# Patient Record
Sex: Male | Born: 1970
Health system: Southern US, Community
[De-identification: ages and names within clinical notes are randomized; demographics above are authoritative.]

## PROBLEM LIST (undated history)

## (undated) DIAGNOSIS — I10 Essential (primary) hypertension: Secondary | ICD-10-CM

## (undated) DIAGNOSIS — E785 Hyperlipidemia, unspecified: Secondary | ICD-10-CM

## (undated) DIAGNOSIS — F17200 Nicotine dependence, unspecified, uncomplicated: Secondary | ICD-10-CM

## (undated) HISTORY — DX: Essential (primary) hypertension: I10

## (undated) HISTORY — DX: Hyperlipidemia, unspecified: E78.5

## (undated) HISTORY — DX: Nicotine dependence, unspecified, uncomplicated: F17.200

---

## 2015-06-19 ENCOUNTER — Encounter: Payer: Self-pay | Admitting: Family Medicine

## 2015-07-03 ENCOUNTER — Encounter: Payer: Self-pay | Admitting: Family Medicine

## 2015-07-03 ENCOUNTER — Ambulatory Visit (INDEPENDENT_AMBULATORY_CARE_PROVIDER_SITE_OTHER): Payer: 59 | Admitting: Family Medicine

## 2015-07-03 VITALS — BP 180/110 | HR 100 | Temp 97.7°F | Resp 16 | Ht 72.0 in | Wt 182.0 lb

## 2015-07-03 DIAGNOSIS — I10 Essential (primary) hypertension: Secondary | ICD-10-CM

## 2015-07-03 DIAGNOSIS — Z0000 Encounter for general adult medical examination without abnormal findings: Secondary | ICD-10-CM | POA: Diagnosis not present

## 2015-07-03 LAB — CBC WITH DIFFERENTIAL/PLATELET
Basophils Absolute: 0 10*3/uL (ref 0.0–0.1)
Basophils Relative: 0 % (ref 0–1)
Eosinophils Absolute: 0.1 10*3/uL (ref 0.0–0.7)
Eosinophils Relative: 2 % (ref 0–5)
HEMATOCRIT: 41.2 % (ref 39.0–52.0)
HEMOGLOBIN: 14 g/dL (ref 13.0–17.0)
LYMPHS ABS: 1.5 10*3/uL (ref 0.7–4.0)
LYMPHS PCT: 29 % (ref 12–46)
MCH: 29.2 pg (ref 26.0–34.0)
MCHC: 34 g/dL (ref 30.0–36.0)
MCV: 86 fL (ref 78.0–100.0)
MONO ABS: 0.5 10*3/uL (ref 0.1–1.0)
MPV: 10.2 fL (ref 8.6–12.4)
Monocytes Relative: 9 % (ref 3–12)
NEUTROS ABS: 3.1 10*3/uL (ref 1.7–7.7)
Neutrophils Relative %: 60 % (ref 43–77)
Platelets: 321 10*3/uL (ref 150–400)
RBC: 4.79 MIL/uL (ref 4.22–5.81)
RDW: 14.2 % (ref 11.5–15.5)
WBC: 5.2 10*3/uL (ref 4.0–10.5)

## 2015-07-03 LAB — LIPID PANEL
CHOL/HDL RATIO: 4.6 ratio (ref <=5.0)
CHOLESTEROL: 174 mg/dL (ref 125–200)
HDL: 38 mg/dL — ABNORMAL LOW (ref >=40)
LDL Cholesterol: 110 mg/dL (ref <130)
Triglycerides: 129 mg/dL (ref <150)
VLDL: 26 mg/dL (ref <30)

## 2015-07-03 LAB — COMPLETE METABOLIC PANEL WITH GFR
ALBUMIN: 3.7 g/dL (ref 3.6–5.1)
ALK PHOS: 104 U/L (ref 40–115)
ALT: 14 U/L (ref 9–46)
AST: 15 U/L (ref 10–40)
BUN: 9 mg/dL (ref 7–25)
CALCIUM: 9.6 mg/dL (ref 8.6–10.3)
CO2: 28 mmol/L (ref 20–31)
Chloride: 106 mmol/L (ref 98–110)
Creat: 0.81 mg/dL (ref 0.60–1.35)
Glucose, Bld: 81 mg/dL (ref 70–99)
POTASSIUM: 3.8 mmol/L (ref 3.5–5.3)
Sodium: 143 mmol/L (ref 135–146)
Total Bilirubin: 0.3 mg/dL (ref 0.2–1.2)
Total Protein: 6.9 g/dL (ref 6.1–8.1)

## 2015-07-03 MED ORDER — LOSARTAN POTASSIUM 50 MG PO TABS: 50.0000 mg | ORAL_TABLET | Freq: Every day | ORAL | Status: DC

## 2015-07-03 NOTE — Progress Notes (Signed)
Subjective:    Patient ID: Bobby Reed, male    DOB: 21-Feb-1971, 45 y.o.   MRN: [id number]  HPI  Patient is here today to establish care. He is a very pleasant 45 year old white male. He goes by [employer]. He works in [employer].  His wife works as a Engineer, civil (consulting) at [employer]. He denies any medical concerns. He would like to quit smoking. He does admit that his wife has told him his blood pressure is elevated at home. However the numbers that she has been recording are no where near the elevation that he has here today. He denies any chest pain shortness of breath or dyspnea on exertion. He denies any significant past medical history Past Medical History  Diagnosis Date  . Hypertension    No past surgical history on file. No current outpatient prescriptions on file prior to visit.   No current facility-administered medications on file prior to visit.   No Known Allergies Social History   Social History  . Marital Status: Married    Spouse Name: N/A  . Number of Children: N/A  . Years of Education: N/A   Occupational History  . Not on file.   Social History Main Topics  . Smoking status: Current Every Day Smoker -- 1.00 packs/day    Types: Cigarettes  . Smokeless tobacco: Never Used  . Alcohol Use: No  . Drug Use: No  . Sexual Activity: Yes   Other Topics Concern  . Not on file   Social History Narrative   Family History  Problem Relation Age of Onset  . Cancer Father     throat cancer/heavy smoker  . Heart disease Maternal Grandfather   . Dementia Paternal Grandmother   . Cancer Paternal Grandfather       Review of Systems  All other systems reviewed and are negative.      Objective:   Physical Exam  Constitutional: He is oriented to person, place, and time. He appears well-developed and well-nourished. No distress.  HENT:  Head: Normocephalic and atraumatic.  Right Ear: External ear normal.  Left Ear: External ear normal.  Nose: Nose normal.    Mouth/Throat: Oropharynx is clear and moist. No oropharyngeal exudate.  Eyes: Conjunctivae and EOM are normal. Pupils are equal, round, and reactive to light. Right eye exhibits no discharge. Left eye exhibits no discharge. No scleral icterus.  Neck: Normal range of motion. Neck supple. No JVD present. No tracheal deviation present. No thyromegaly present.  Cardiovascular: Normal rate, regular rhythm and normal heart sounds.  Exam reveals no gallop and no friction rub.   No murmur heard. Pulmonary/Chest: Effort normal and breath sounds normal. No stridor. No respiratory distress. He has no wheezes. He has no rales. He exhibits no tenderness.  Abdominal: Soft. Bowel sounds are normal. He exhibits no distension and no mass. There is no tenderness. There is no rebound and no guarding.  Musculoskeletal: Normal range of motion. He exhibits no edema or tenderness.  Lymphadenopathy:    He has no cervical adenopathy.  Neurological: He is alert and oriented to person, place, and time. He has normal reflexes. He displays normal reflexes. No cranial nerve deficit. He exhibits normal muscle tone. Coordination normal.  Skin: Skin is warm. No rash noted. He is not diaphoretic. No erythema. No pallor.  Psychiatric: He has a normal mood and affect. His behavior is normal. Judgment and thought content normal.  Vitals reviewed.         Assessment & Plan:  Benign essential HTN - Plan: losartan (COZAAR) 50 MG tablet, CBC with Differential/Platelet, COMPLETE METABOLIC PANEL WITH GFR, Lipid panel  Gen. medical exam  Physical exam is significant for significantly elevated blood pressure. Begin losartan 50 mg by mouth daily and recheck blood pressure in 2 weeks. We did discuss smoking cessation. If he is tolerating losartan in 2 weeks, I will try the patient on Chantix. I will also check a CBC, fasting lipid panel, CMP. Patient politely declines a flu shot today. Tetanus shot was performed 8 years ago and is  up-to-date. He is not due for prostate cancer screening or colonoscopy until age 45. Recheck blood pressure in 2 weeks

## 2015-08-01 ENCOUNTER — Telehealth: Payer: Self-pay | Admitting: Family Medicine

## 2015-08-01 NOTE — Telephone Encounter (Signed)
Patient aware of results.

## 2015-08-01 NOTE — Telephone Encounter (Signed)
Patient is calling to give a blood pressure reading, he said per dr Tanya Nonespickard, this reading is 122/86 and also would like his lab results  [phone]

## 2015-08-02 NOTE — Telephone Encounter (Signed)
Pt aware.

## 2015-08-02 NOTE — Telephone Encounter (Signed)
Blood pressure is good.  No changes at this time.

## 2015-08-17 ENCOUNTER — Telehealth: Payer: Self-pay | Admitting: Family Medicine

## 2015-08-17 MED ORDER — VARENICLINE TARTRATE 0.5 MG X 11 & 1 MG X 42 PO MISC: ORAL | Status: DC

## 2015-08-17 MED FILL — CHANTIX STARTING MONTH BOX: 0.5 MG X 11 | 28 days supply | Qty: 53 | Fill #0

## 2015-08-17 NOTE — Telephone Encounter (Signed)
Patient is calling to get chantix, since tolerating medication well  [facility]  [phone] if any questions

## 2015-08-17 NOTE — Telephone Encounter (Signed)
Per lov note med sent to pharm as requested

## 2015-09-24 ENCOUNTER — Encounter: Payer: Self-pay | Admitting: Family Medicine

## 2015-09-24 ENCOUNTER — Ambulatory Visit (HOSPITAL_COMMUNITY)
Admission: RE | Admit: 2015-09-24 | Discharge: 2015-09-24 | Disposition: A | Discharge status: Home / Self Care | Payer: 59 | Source: Other Acute Inpatient Hospital | Attending: Family Medicine | Admitting: Family Medicine

## 2015-09-24 ENCOUNTER — Ambulatory Visit (INDEPENDENT_AMBULATORY_CARE_PROVIDER_SITE_OTHER): Payer: 59 | Admitting: Family Medicine

## 2015-09-24 VITALS — BP 120/74 | HR 68 | Temp 98.3°F | Resp 16 | Wt 184.0 lb

## 2015-09-24 DIAGNOSIS — J189 Pneumonia, unspecified organism: Secondary | ICD-10-CM | POA: Diagnosis not present

## 2015-09-24 MED ORDER — AZITHROMYCIN 250 MG PO TABS: ORAL_TABLET | ORAL | Status: DC

## 2015-09-24 MED FILL — AZITHROMYCIN 250 MG TABLET: 250 | 5 days supply | Qty: 6 | Fill #0

## 2015-09-24 NOTE — Progress Notes (Signed)
   Subjective:    Patient ID: Bobby Reed, male    DOB: 11/07/1970, 45 y.o.   MRN: [id number]  HPI Patient reports a cough for 3 weeks. The cough is productive of thick green sputum. He also reports dyspnea on exertion, and fatigue. Symptoms began after his grandmother developed pneumonia. His son has also had similar symptoms. He denies any fever. He denies any hemoptysis. He denies any pleurisy. He does report night sweats. Physical exam however is much worse than he physically appears. He has diminished breath sounds bilaterally with bibasilar crackles Past Medical History  Diagnosis Date  . Hypertension    No past surgical history on file. Current Outpatient Prescriptions on File Prior to Visit  Medication Sig Dispense Refill  . losartan (COZAAR) 50 MG tablet Take 1 tablet (50 mg total) by mouth daily. 90 tablet 3  . varenicline (CHANTIX PAK) 0.5 MG X 11 & 1 MG X 42 tablet As directed 53 tablet 0   No current facility-administered medications on file prior to visit.   No Known Allergies Social History   Social History  . Marital Status: Married    Spouse Name: N/A  . Number of Children: N/A  . Years of Education: N/A   Occupational History  . Not on file.   Social History Main Topics  . Smoking status: Current Every Day Smoker -- 1.00 packs/day    Types: Cigarettes  . Smokeless tobacco: Never Used  . Alcohol Use: No  . Drug Use: No  . Sexual Activity: Yes   Other Topics Concern  . Not on file   Social History Narrative      Review of Systems  All other systems reviewed and are negative.      Objective:   Physical Exam  HENT:  Right Ear: External ear normal.  Left Ear: External ear normal.  Nose: Nose normal.  Mouth/Throat: Oropharynx is clear and moist.  Eyes: Conjunctivae are normal.  Neck: Neck supple.  Cardiovascular: Normal rate and regular rhythm.   Pulmonary/Chest: Effort normal. He has decreased breath sounds. He has wheezes in the right lower  field and the left lower field. He has rales.  Lymphadenopathy:    He has no cervical adenopathy.          Assessment & Plan:  Walking pneumonia - Plan: azithromycin (ZITHROMAX) 250 MG tablet, DG Chest 2 View  Patient's exam is consistent with walking pneumonia. I want him to go get an x-ray immediately. Meanwhile start the patient on a Z-Pak for community-acquired pneumonia. Await the results of the chest x-ray. Follow-up later this week if no better or sooner if worse.

## 2015-09-25 ENCOUNTER — Ambulatory Visit (HOSPITAL_COMMUNITY)
Admission: RE | Admit: 2015-09-25 | Discharge: 2015-09-25 | Disposition: A | Discharge status: Home / Self Care | Payer: 59 | Source: Ambulatory Visit | Attending: Family Medicine | Admitting: Family Medicine

## 2015-09-25 DIAGNOSIS — R05 Cough: Secondary | ICD-10-CM | POA: Diagnosis not present

## 2015-09-25 DIAGNOSIS — J189 Pneumonia, unspecified organism: Secondary | ICD-10-CM

## 2015-10-05 MED FILL — LOSARTAN POTASSIUM 50 MG TA: 50 | 90 days supply | Qty: 90 | Fill #0

## 2015-10-10 ENCOUNTER — Other Ambulatory Visit: Payer: Self-pay | Admitting: Family Medicine

## 2015-10-10 MED ORDER — VARENICLINE TARTRATE 1 MG PO TABS: 1.0000 mg | ORAL_TABLET | Freq: Two times a day (BID) | ORAL | Status: DC

## 2015-10-10 MED FILL — CHANTIX 1 MG TABLET: 1 | 30 days supply | Qty: 60 | Fill #0

## 2015-10-10 NOTE — Telephone Encounter (Signed)
Medication called/sent to requested pharmacy  

## 2016-01-01 ENCOUNTER — Encounter: Payer: Self-pay | Admitting: Family Medicine

## 2016-01-01 ENCOUNTER — Ambulatory Visit (INDEPENDENT_AMBULATORY_CARE_PROVIDER_SITE_OTHER): Payer: 59 | Admitting: Family Medicine

## 2016-01-01 VITALS — BP 150/98 | HR 84 | Temp 97.9°F | Resp 18 | Ht 72.0 in | Wt 197.0 lb

## 2016-01-01 DIAGNOSIS — R05 Cough: Secondary | ICD-10-CM | POA: Diagnosis not present

## 2016-01-01 DIAGNOSIS — R053 Chronic cough: Secondary | ICD-10-CM

## 2016-01-01 NOTE — Progress Notes (Signed)
Subjective:    Patient ID: Bobby Reed, male    DOB: 26-Jul-1970, 45 y.o.   MRN: [id number]  HPI  09/24/15 Patient reports a cough for 3 weeks. The cough is productive of thick green sputum. He also reports dyspnea on exertion, and fatigue. Symptoms began after his grandmother developed pneumonia. His son has also had similar symptoms. He denies any fever. He denies any hemoptysis. He denies any pleurisy. He does report night sweats. Physical exam however is much worse than he physically appears. He has diminished breath sounds bilaterally with bibasilar crackles.  At that time, my plan was: Patient's exam is consistent with walking pneumonia. I want him to go get an x-ray immediately. Meanwhile start the patient on a Z-Pak for community-acquired pneumonia. Await the results of the chest x-ray. Follow-up later this week if no better or sooner if worse.  01/01/16 Chest x-ray was clear. Patient completed antibiotics. His cough never completely went away. He no longer has a productive cough but he continues to have a daily cough that is more of a tickle in his throat. He also reports wheezing. His wife states that his cough is worse thing in the morning. She is concerned that he may be aspirating stomach acid/laryngo-esophageal reflux that may be causing his cough. She tried him on Prilosec 20 mg a day. This helped some. On his exam today he still has diminished breath sounds bilaterally with faint expiratory wheezing and occasional expiratory rhonchi. He does have a 30+ pack year history of smoking. He works in [employer] but denies any exposure to toxic chemicals. He denies any allergies. He denies any rhinorrhea or sneezing. He denies any sinus pressure Past Medical History:  Diagnosis Date  . Hypertension    No past surgical history on file. Current Outpatient Prescriptions on File Prior to Visit  Medication Sig Dispense Refill  . losartan (COZAAR) 50 MG tablet Take 1 tablet (50 mg total) by  mouth daily. 90 tablet 3  . varenicline (CHANTIX CONTINUING MONTH PAK) 1 MG tablet Take 1 tablet (1 mg total) by mouth 2 (two) times daily. 60 tablet 2   No current facility-administered medications on file prior to visit.    No Known Allergies Social History   Social History  . Marital status: Married    Spouse name: N/A  . Number of children: N/A  . Years of education: N/A   Occupational History  . Not on file.   Social History Main Topics  . Smoking status: Former Smoker    Packs/day: 1.00    Types: Cigarettes  . Smokeless tobacco: Never Used  . Alcohol use No  . Drug use: No  . Sexual activity: Yes   Other Topics Concern  . Not on file   Social History Narrative  . No narrative on file      Review of Systems  All other systems reviewed and are negative.      Objective:   Physical Exam  HENT:  Right Ear: External ear normal.  Left Ear: External ear normal.  Nose: Nose normal.  Mouth/Throat: Oropharynx is clear and moist.  Eyes: Conjunctivae are normal.  Neck: Neck supple.  Cardiovascular: Normal rate and regular rhythm.   Pulmonary/Chest: Effort normal. He has decreased breath sounds. He has wheezes in the right lower field and the left lower field. He has rales.  Lymphadenopathy:    He has no cervical adenopathy.          Assessment & Plan:  Chronic cough I suspect that the patient may have an element of COPD given his exam and his smoking history. The diminished breath sounds in the persistent rhonchorous breath sounds makes me concerned about possible interstitial lung disease. Certainly laryngo-esophageal reflux as a possibility as a cause of a chronic cough as well. I will start with pulmonary function test. Depending upon this, I recommend a CT scan of the chest to evaluate for interstitial lung disease. I see no evidence of his exam of allergies. PFTs reveal an FEV1 of 3.19 L, 72% of predicted, an FVC of 3.68 L 67% of predicted, an FEV1 to FVC  ratio of 87%. This suggests restrictive lung disease.  However his presentation is more consistent with obstructive lung disease. I will try the patient on Symbicort 160/4.5 2 puffs inhaled twice daily and recheck in 2 weeks. If symptoms are not improving, proceed with a CT scan of the chest

## 2016-01-15 ENCOUNTER — Ambulatory Visit (INDEPENDENT_AMBULATORY_CARE_PROVIDER_SITE_OTHER): Payer: 59 | Admitting: Family Medicine

## 2016-01-15 ENCOUNTER — Encounter: Payer: Self-pay | Admitting: Family Medicine

## 2016-01-15 VITALS — BP 134/98 | HR 80 | Temp 97.6°F | Resp 16 | Ht 72.0 in | Wt 196.0 lb

## 2016-01-15 DIAGNOSIS — R05 Cough: Secondary | ICD-10-CM

## 2016-01-15 DIAGNOSIS — R053 Chronic cough: Secondary | ICD-10-CM

## 2016-01-15 NOTE — Progress Notes (Signed)
Subjective:    Patient ID: Bobby Reed, male    DOB: 10/12/1970, 45 y.o.   MRN: [id number]  HPI  09/24/15 Patient reports a cough for 3 weeks. The cough is productive of thick green sputum. He also reports dyspnea on exertion, and fatigue. Symptoms began after his grandmother developed pneumonia. His son has also had similar symptoms. He denies any fever. He denies any hemoptysis. He denies any pleurisy. He does report night sweats. Physical exam however is much worse than he physically appears. He has diminished breath sounds bilaterally with bibasilar crackles.  At that time, my plan was: Patient's exam is consistent with walking pneumonia. I want him to go get an x-ray immediately. Meanwhile start the patient on a Z-Pak for community-acquired pneumonia. Await the results of the chest x-ray. Follow-up later this week if no better or sooner if worse.  01/01/16 Chest x-ray was clear. Patient completed antibiotics. His cough never completely went away. He no longer has a productive cough but he continues to have a daily cough that is more of a tickle in his throat. He also reports wheezing. His wife states that his cough is worse thing in the morning. She is concerned that he may be aspirating stomach acid/laryngo-esophageal reflux that may be causing his cough. She tried him on Prilosec 20 mg a day. This helped some. On his exam today he still has diminished breath sounds bilaterally with faint expiratory wheezing and occasional expiratory rhonchi. He does have a 30+ pack year history of smoking. He works in [employer] but denies any exposure to toxic chemicals. He denies any allergies. He denies any rhinorrhea or sneezing. He denies any sinus pressure.  At that time, my plan was: I suspect that the patient may have an element of COPD given his exam and his smoking history. The diminished breath sounds in the persistent rhonchorous breath sounds makes me concerned about possible interstitial lung  disease. Certainly laryngo-esophageal reflux as a possibility as a cause of a chronic cough as well. I will start with pulmonary function test. Depending upon this, I recommend a CT scan of the chest to evaluate for interstitial lung disease. I see no evidence of his exam of allergies. PFTs reveal an FEV1 of 3.19 L, 72% of predicted, an FVC of 3.68 L 67% of predicted, an FEV1 to FVC ratio of 87%. This suggests restrictive lung disease.  However his presentation is more consistent with obstructive lung disease. I will try the patient on Symbicort 160/4.5 2 puffs inhaled twice daily and recheck in 2 weeks. If symptoms are not improving, proceed with a CT scan of the chest  01/15/16 The patient's cough completely went away after starting Symbicort. Although he is also been on omeprazole around the same time. He feels much better now. On his exam today, he still has some faint expiratory wheezing but this has improved dramatically. I believe the majority of his chronic cough is due to underlying obstructive lung disease/emphysema. Past Medical History:  Diagnosis Date  . Hypertension    No past surgical history on file. Current Outpatient Prescriptions on File Prior to Visit  Medication Sig Dispense Refill  . losartan (COZAAR) 50 MG tablet Take 1 tablet (50 mg total) by mouth daily. 90 tablet 3  . omeprazole (PRILOSEC) 20 MG capsule Take 20 mg by mouth daily.    . varenicline (CHANTIX CONTINUING MONTH PAK) 1 MG tablet Take 1 tablet (1 mg total) by mouth 2 (two) times daily. 60 tablet  2   No current facility-administered medications on file prior to visit.    No Known Allergies Social History   Social History  . Marital status: Married    Spouse name: N/A  . Number of children: N/A  . Years of education: N/A   Occupational History  . Not on file.   Social History Main Topics  . Smoking status: Former Smoker    Packs/day: 1.00    Types: Cigarettes  . Smokeless tobacco: Never Used  .  Alcohol use No  . Drug use: No  . Sexual activity: Yes   Other Topics Concern  . Not on file   Social History Narrative  . No narrative on file      Review of Systems  All other systems reviewed and are negative.      Objective:   Physical Exam  HENT:  Right Ear: External ear normal.  Left Ear: External ear normal.  Nose: Nose normal.  Mouth/Throat: Oropharynx is clear and moist.  Eyes: Conjunctivae are normal.  Neck: Neck supple.  Cardiovascular: Normal rate and regular rhythm.   Pulmonary/Chest: Effort normal. He has no decreased breath sounds. He has wheezes. He has no rhonchi. He has no rales.  Lymphadenopathy:    He has no cervical adenopathy.  Vitals reviewed.         Assessment & Plan:  Chronic cough I believe the patient's chronic cough is due to obstructive lung disease and emphysema. I believe it was exacerbated by respiratory infection. I will  Have the patient discontinue omeprazole. Should the cough resume, I would leave him on the omeprazole indefinitely.  I have given the patient enough samples of symbicort to last 2 months.  If, at that time, he continues to refrain from smoking, I would like him to try to discontinue symbicort and see if the cough returns.

## 2016-01-18 MED FILL — LOSARTAN POTASSIUM 50 MG TA: 50 | 90 days supply | Qty: 90 | Fill #1

## 2016-05-12 MED FILL — LOSARTAN POTASSIUM 50 MG TA: 50 | 90 days supply | Qty: 90 | Fill #2

## 2016-08-18 ENCOUNTER — Other Ambulatory Visit: Payer: Self-pay | Admitting: Family Medicine

## 2016-08-18 DIAGNOSIS — I10 Essential (primary) hypertension: Secondary | ICD-10-CM

## 2016-08-18 MED FILL — LOSARTAN POTASSIUM 50 MG TA: 50 | 90 days supply | Qty: 90 | Fill #0

## 2016-12-15 MED FILL — LOSARTAN POTASSIUM 50 MG TA: 50 | 90 days supply | Qty: 90 | Fill #1

## 2017-03-12 ENCOUNTER — Ambulatory Visit (INDEPENDENT_AMBULATORY_CARE_PROVIDER_SITE_OTHER): Payer: 59 | Admitting: Family Medicine

## 2017-03-12 ENCOUNTER — Encounter: Payer: Self-pay | Admitting: Family Medicine

## 2017-03-12 VITALS — BP 138/102 | HR 93 | Temp 98.2°F | Resp 16 | Ht 72.0 in | Wt 191.0 lb

## 2017-03-12 DIAGNOSIS — J40 Bronchitis, not specified as acute or chronic: Secondary | ICD-10-CM | POA: Diagnosis not present

## 2017-03-12 DIAGNOSIS — I10 Essential (primary) hypertension: Secondary | ICD-10-CM

## 2017-03-12 MED ORDER — AZITHROMYCIN 250 MG PO TABS: ORAL_TABLET | ORAL | 0 refills | Status: DC

## 2017-03-12 MED ORDER — HYDROCHLOROTHIAZIDE 25 MG PO TABS: 25.0000 mg | ORAL_TABLET | Freq: Every day | ORAL | 3 refills | Status: DC

## 2017-03-12 MED FILL — AZITHROMYCIN 250 MG TABLET: 250 | 5 days supply | Qty: 6 | Fill #0

## 2017-03-12 MED FILL — HYDROCHLOROTHIAZIDE 25 MG T: 25 | 90 days supply | Qty: 90 | Fill #0

## 2017-03-12 NOTE — Progress Notes (Signed)
Subjective:    Patient ID: Bobby Reed, male    DOB: 10/17/1970, 46 y.o.   MRN: [id number]  HPI  09/24/15 Patient reports a cough for 3 weeks. The cough is productive of thick green sputum. He also reports dyspnea on exertion, and fatigue. Symptoms began after his grandmother developed pneumonia. His son has also had similar symptoms. He denies any fever. He denies any hemoptysis. He denies any pleurisy. He does report night sweats. Physical exam however is much worse than he physically appears. He has diminished breath sounds bilaterally with bibasilar crackles.  At that time, my plan was: Patient's exam is consistent with walking pneumonia. I want him to go get an x-ray immediately. Meanwhile start the patient on a Z-Pak for community-acquired pneumonia. Await the results of the chest x-ray. Follow-up later this week if no better or sooner if worse.  01/01/16 Chest x-ray was clear. Patient completed antibiotics. His cough never completely went away. He no longer has a productive cough but he continues to have a daily cough that is more of a tickle in his throat. He also reports wheezing. His wife states that his cough is worse thing in the morning. She is concerned that he may be aspirating stomach acid/laryngo-esophageal reflux that may be causing his cough. She tried him on Prilosec 20 mg a day. This helped some. On his exam today he still has diminished breath sounds bilaterally with faint expiratory wheezing and occasional expiratory rhonchi. He does have a 30+ pack year history of smoking. He works in [employer] but denies any exposure to toxic chemicals. He denies any allergies. He denies any rhinorrhea or sneezing. He denies any sinus pressure.  At that time, my plan was: I suspect that the patient may have an element of COPD given his exam and his smoking history. The diminished breath sounds in the persistent rhonchorous breath sounds makes me concerned about possible interstitial lung  disease. Certainly laryngo-esophageal reflux as a possibility as a cause of a chronic cough as well. I will start with pulmonary function test. Depending upon this, I recommend a CT scan of the chest to evaluate for interstitial lung disease. I see no evidence of his exam of allergies. PFTs reveal an FEV1 of 3.19 L, 72% of predicted, an FVC of 3.68 L 67% of predicted, an FEV1 to FVC ratio of 87%. This suggests restrictive lung disease.  However his presentation is more consistent with obstructive lung disease. I will try the patient on Symbicort 160/4.5 2 puffs inhaled twice daily and recheck in 2 weeks. If symptoms are not improving, proceed with a CT scan of the chest  01/15/16 The patient's cough completely went away after starting Symbicort. Although he is also been on omeprazole around the same time. He feels much better now. On his exam today, he still has some faint expiratory wheezing but this has improved dramatically. I believe the majority of his chronic cough is due to underlying obstructive lung disease/emphysema.  At that time, my plan was: I believe the patient's chronic cough is due to obstructive lung disease and emphysema. I believe it was exacerbated by respiratory infection. I will  Have the patient discontinue omeprazole. Should the cough resume, I would leave him on the omeprazole indefinitely.  I have given the patient enough samples of symbicort to last 2 months.  If, at that time, he continues to refrain from smoking, I would like him to try to discontinue symbicort and see if the cough returns.  03/12/17 Patient is here today accompanied by his wife.  He reports a one-month history of chest congestion cough and wheezing.  They believe is due to the blood pressure medication not the smoking.  Unfortunately he continues to smoke.  Therefore he is discontinued losartan and his blood pressure is high as a result.  He denies any hemoptysis.  He denies any fevers or chills.  He does  report increasing shortness of breath.  I am more suspicious of smoking as a cause of his frequent and recurrent cough or possibly even acid reflux. Past Medical History:  Diagnosis Date  . Hypertension    No past surgical history on file. Current Outpatient Medications on File Prior to Visit  Medication Sig Dispense Refill  . losartan (COZAAR) 50 MG tablet TAKE 1 TABLET BY MOUTH ONCE DAILY (Patient not taking: Reported on 03/12/2017) 330 tablet 0   No current facility-administered medications on file prior to visit.    No Known Allergies Social History   Socioeconomic History  . Marital status: Married    Spouse name: Not on file  . Number of children: Not on file  . Years of education: Not on file  . Highest education level: Not on file  Social Needs  . Financial resource strain: Not on file  . Food insecurity - worry: Not on file  . Food insecurity - inability: Not on file  . Transportation needs - medical: Not on file  . Transportation needs - non-medical: Not on file  Occupational History  . Not on file  Tobacco Use  . Smoking status: Current Every Day Smoker    Packs/day: 1.00    Types: Cigarettes  . Smokeless tobacco: Never Used  Substance and Sexual Activity  . Alcohol use: No  . Drug use: No  . Sexual activity: Yes  Other Topics Concern  . Not on file  Social History Narrative  . Not on file      Review of Systems  All other systems reviewed and are negative.      Objective:   Physical Exam  HENT:  Right Ear: External ear normal.  Left Ear: External ear normal.  Nose: Nose normal.  Mouth/Throat: Oropharynx is clear and moist.  Eyes: Conjunctivae are normal.  Neck: Neck supple.  Cardiovascular: Normal rate and regular rhythm.  Pulmonary/Chest: Effort normal. He has no decreased breath sounds. He has wheezes. He has no rhonchi. He has no rales.  Lymphadenopathy:    He has no cervical adenopathy.  Vitals reviewed.         Assessment & Plan:    Bronchitis  Benign essential HTN Patient does appear to have bronchitis today on exam and is wheezing with chest congestion.  I will start the patient on a Z-Pak for bronchitis.  If worsening, I will start him on prednisone.  I explained to the patient and his wife that I do not feel losartan is the cause of his cough.  However I am happy to discontinue the medication and switch to hydrochlorothiazide 25 mg a day to manage his high blood pressure just in case.  However I strongly encouraged him to pursue smoking cessation.  If he continues to have frequent upper respiratory infections and cough and wheezing, I will be glad to get a second opinion with the pulmonologist but I feel that the cough is most likely due to his persistent abuse of tobacco.

## 2017-07-15 IMAGING — DX DG CHEST 2V
2 series · 2 of 2 positions shown · non-contrast
Comparison: None.

CLINICAL DATA: Pneumonia for 3 weeks.  Productive cough.

EXAM:
CHEST  2 VIEW

[chest pa]
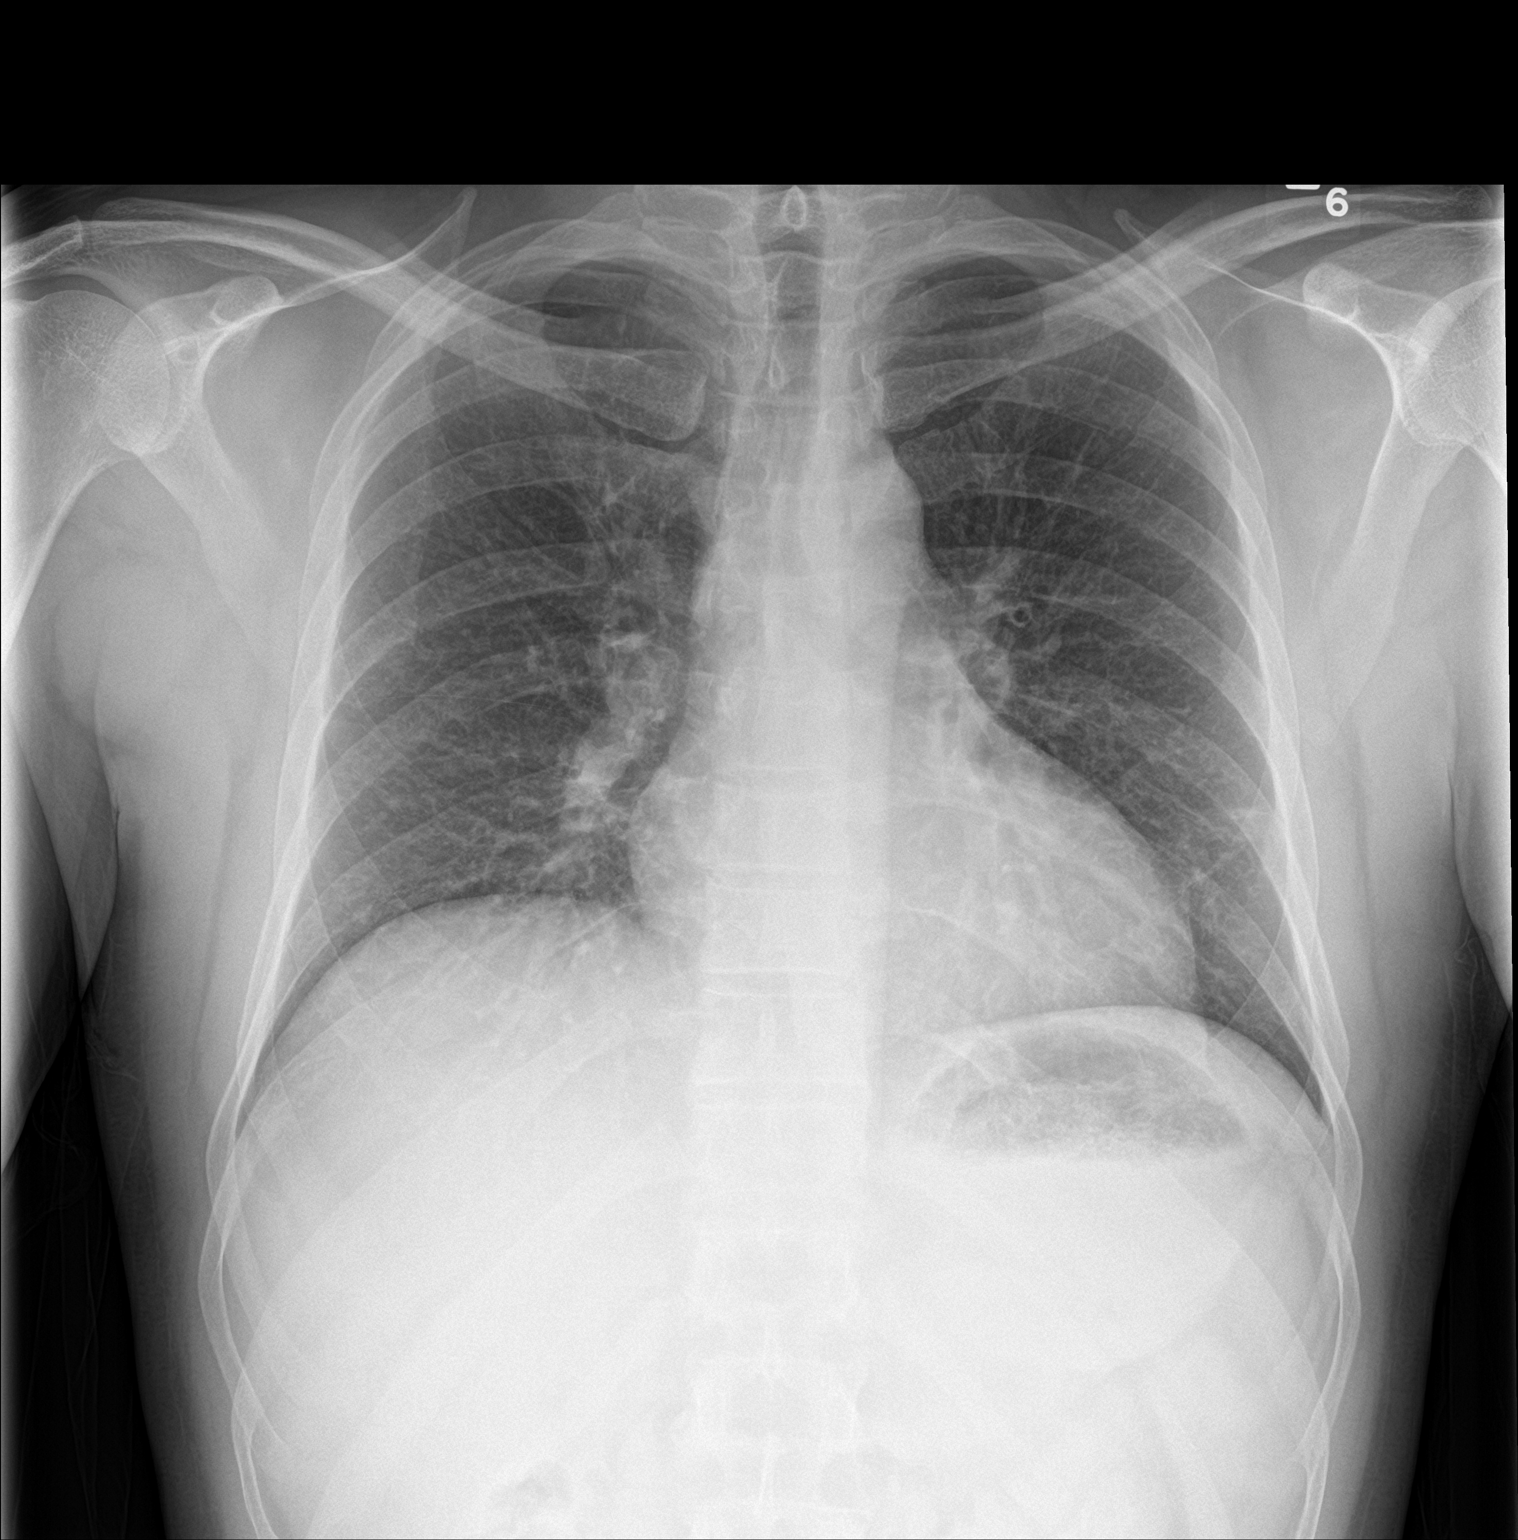

[chest lat]
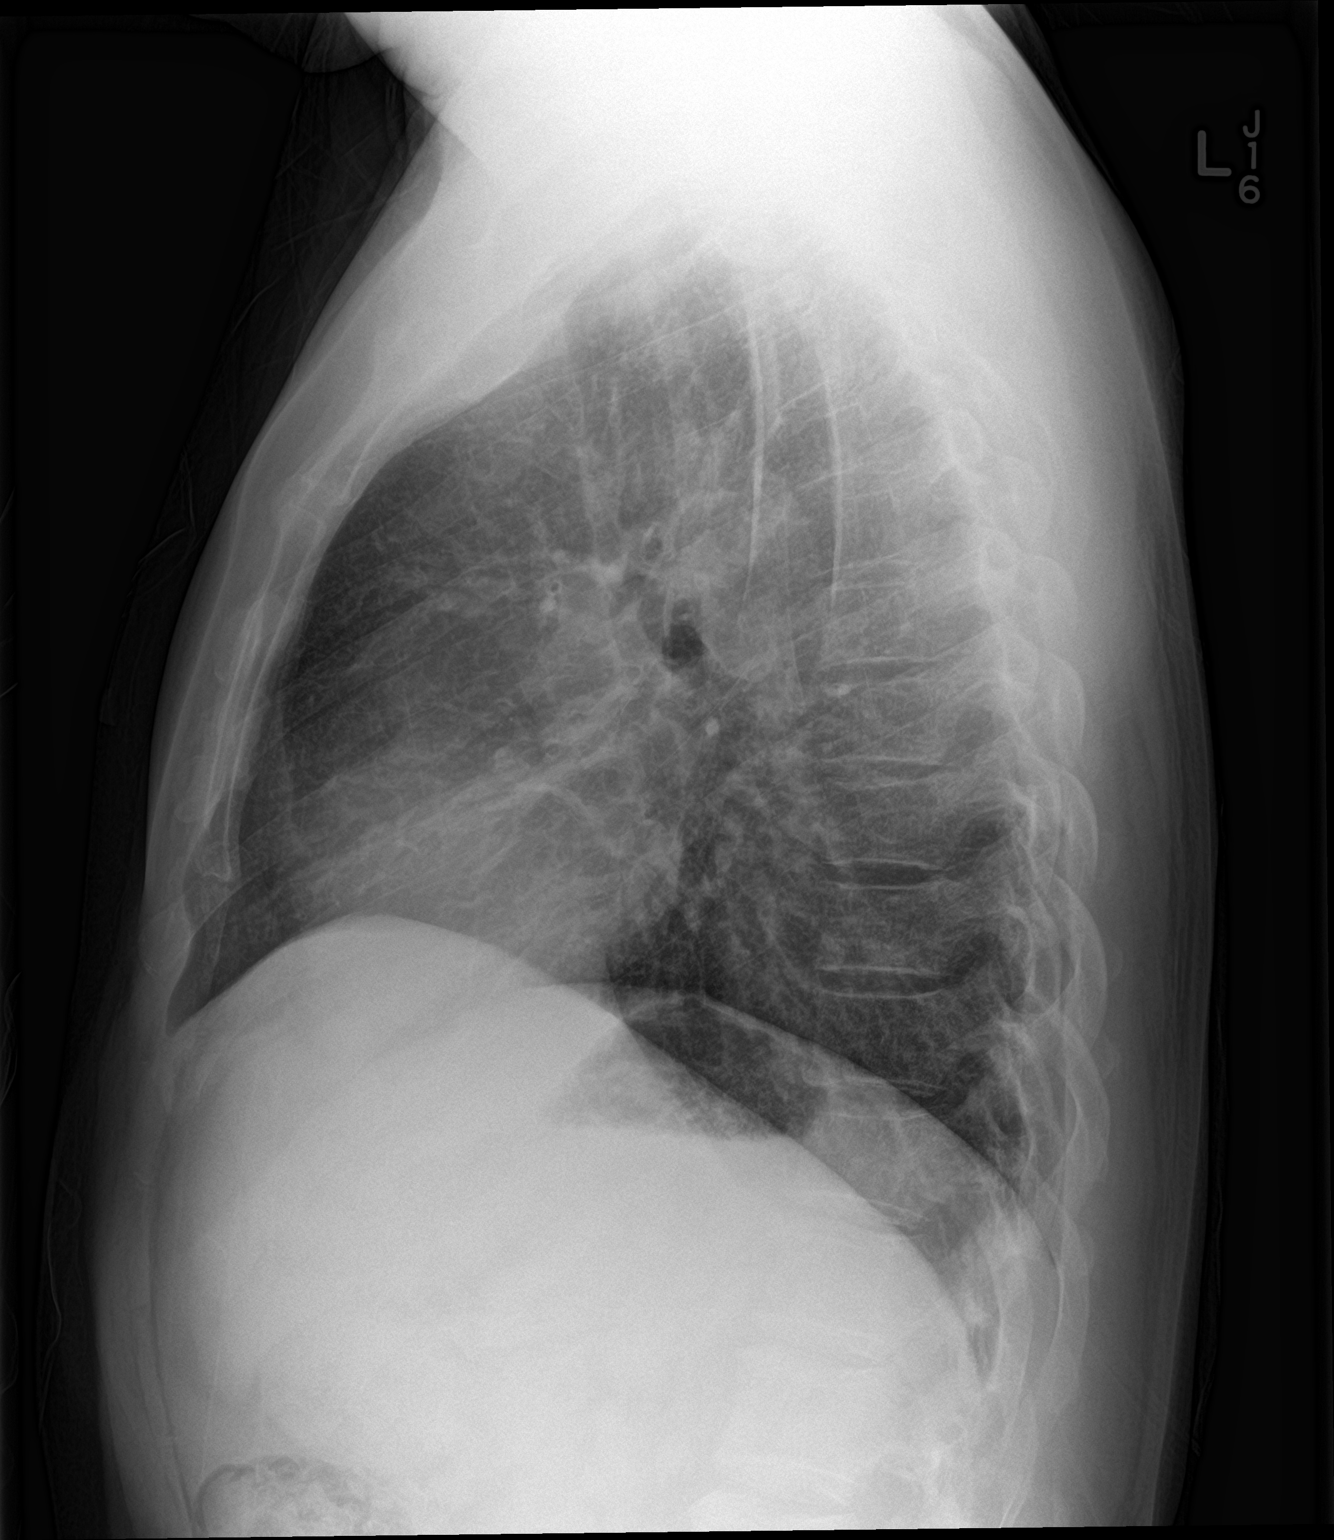

[2 of 2 positions shown; findings below may reference images not displayed]

FINDINGS: Heart is normal size. Minimal density in the lingula, favor
scarring. Right lung is clear. No effusions or acute bony
abnormality.
IMPRESSION: No active cardiopulmonary disease.

## 2017-07-16 MED FILL — HYDROCHLOROTHIAZIDE 25 MG T: 25 | 90 days supply | Qty: 90 | Fill #1

## 2017-08-14 ENCOUNTER — Ambulatory Visit (INDEPENDENT_AMBULATORY_CARE_PROVIDER_SITE_OTHER): Payer: No Typology Code available for payment source | Admitting: Family Medicine

## 2017-08-14 ENCOUNTER — Encounter: Payer: Self-pay | Admitting: Family Medicine

## 2017-08-14 VITALS — BP 120/80 | HR 80 | Temp 98.0°F | Resp 14 | Ht 72.0 in | Wt 193.0 lb

## 2017-08-14 DIAGNOSIS — Z0000 Encounter for general adult medical examination without abnormal findings: Secondary | ICD-10-CM

## 2017-08-14 DIAGNOSIS — I10 Essential (primary) hypertension: Secondary | ICD-10-CM | POA: Diagnosis not present

## 2017-08-14 MED ORDER — VARENICLINE TARTRATE 1 MG PO TABS: 1.0000 mg | ORAL_TABLET | Freq: Two times a day (BID) | ORAL | 4 refills | Status: DC

## 2017-08-14 MED ORDER — VARENICLINE TARTRATE 0.5 MG X 11 & 1 MG X 42 PO MISC: ORAL | 0 refills | Status: DC

## 2017-08-14 NOTE — Progress Notes (Signed)
Subjective:    Patient ID: Bobby Reed, male    DOB: 10/17/1970, 47 y.o.   MRN: [id number]  HPI  09/24/15 Patient reports a cough for 3 weeks. The cough is productive of thick green sputum. He also reports dyspnea on exertion, and fatigue. Symptoms began after his grandmother developed pneumonia. His son has also had similar symptoms. He denies any fever. He denies any hemoptysis. He denies any pleurisy. He does report night sweats. Physical exam however is much worse than he physically appears. He has diminished breath sounds bilaterally with bibasilar crackles.  At that time, my plan was: Patient's exam is consistent with walking pneumonia. I want him to go get an x-ray immediately. Meanwhile start the patient on a Z-Pak for community-acquired pneumonia. Await the results of the chest x-ray. Follow-up later this week if no better or sooner if worse.  01/01/16 Chest x-ray was clear. Patient completed antibiotics. His cough never completely went away. He no longer has a productive cough but he continues to have a daily cough that is more of a tickle in his throat. He also reports wheezing. His wife states that his cough is worse thing in the morning. She is concerned that he may be aspirating stomach acid/laryngo-esophageal reflux that may be causing his cough. She tried him on Prilosec 20 mg a day. This helped some. On his exam today he still has diminished breath sounds bilaterally with faint expiratory wheezing and occasional expiratory rhonchi. He does have a 30+ pack year history of smoking. He works in [employer] but denies any exposure to toxic chemicals. He denies any allergies. He denies any rhinorrhea or sneezing. He denies any sinus pressure.  At that time, my plan was: I suspect that the patient may have an element of COPD given his exam and his smoking history. The diminished breath sounds in the persistent rhonchorous breath sounds makes me concerned about possible interstitial lung  disease. Certainly laryngo-esophageal reflux as a possibility as a cause of a chronic cough as well. I will start with pulmonary function test. Depending upon this, I recommend a CT scan of the chest to evaluate for interstitial lung disease. I see no evidence of his exam of allergies. PFTs reveal an FEV1 of 3.19 L, 72% of predicted, an FVC of 3.68 L 67% of predicted, an FEV1 to FVC ratio of 87%. This suggests restrictive lung disease.  However his presentation is more consistent with obstructive lung disease. I will try the patient on Symbicort 160/4.5 2 puffs inhaled twice daily and recheck in 2 weeks. If symptoms are not improving, proceed with a CT scan of the chest  01/15/16 The patient's cough completely went away after starting Symbicort. Although he is also been on omeprazole around the same time. He feels much better now. On his exam today, he still has some faint expiratory wheezing but this has improved dramatically. I believe the majority of his chronic cough is due to underlying obstructive lung disease/emphysema.  At that time, my plan was: I believe the patient's chronic cough is due to obstructive lung disease and emphysema. I believe it was exacerbated by respiratory infection. I will  Have the patient discontinue omeprazole. Should the cough resume, I would leave him on the omeprazole indefinitely.  I have given the patient enough samples of symbicort to last 2 months.  If, at that time, he continues to refrain from smoking, I would like him to try to discontinue symbicort and see if the cough returns.  03/12/17 Patient is here today accompanied by his wife.  He reports a one-month history of chest congestion cough and wheezing.  They believe is due to the blood pressure medication not the smoking.  Unfortunately he continues to smoke.  Therefore he is discontinued losartan and his blood pressure is high as a result.  He denies any hemoptysis.  He denies any fevers or chills.  He does  report increasing shortness of breath.  I am more suspicious of smoking as a cause of his frequent and recurrent cough or possibly even acid reflux.  At that time, my plan was: Patient does appear to have bronchitis today on exam and is wheezing with chest congestion.  I will start the patient on a Z-Pak for bronchitis.  If worsening, I will start him on prednisone.  I explained to the patient and his wife that I do not feel losartan is the cause of his cough.  However I am happy to discontinue the medication and switch to hydrochlorothiazide 25 mg a day to manage his high blood pressure just in case.  However I strongly encouraged him to pursue smoking cessation.  If he continues to have frequent upper respiratory infections and cough and wheezing, I will be glad to get a second opinion with the pulmonologist but I feel that the cough is most likely due to his persistent abuse of tobacco.  08/14/17  Here for CPE. The patient's cough resolved after he discontinued losartan.  He has not had any further chronic cough since that time.  He is no longer taking any type of inhaler.  He denies any hemoptysis.  He denies any shortness of breath.  He denies any audible wheezing.  He is working to reduce his smoking.  He went from 1-1/2 packs of cigarettes per day to a half a pack a day.  He has had some success with Chantix in the past and is interested in trying it again.  Otherwise he is doing well with no concerns and is here today primarily for his physical exam.  He is due for a tetanus shot but he politely declines it. Past Medical History:  Diagnosis Date  . Hypertension    No past surgical history on file. Current Outpatient Medications on File Prior to Visit  Medication Sig Dispense Refill  . hydrochlorothiazide (HYDRODIURIL) 25 MG tablet Take 1 tablet (25 mg total) by mouth daily. 90 tablet 3   No current facility-administered medications on file prior to visit.    No Known Allergies Social History    Socioeconomic History  . Marital status: Married    Spouse name: Not on file  . Number of children: Not on file  . Years of education: Not on file  . Highest education level: Not on file  Occupational History  . Not on file  Social Needs  . Financial resource strain: Not on file  . Food insecurity:    Worry: Not on file    Inability: Not on file  . Transportation needs:    Medical: Not on file    Non-medical: Not on file  Tobacco Use  . Smoking status: Current Every Day Smoker    Packs/day: 1.00    Types: Cigarettes  . Smokeless tobacco: Never Used  Substance and Sexual Activity  . Alcohol use: No  . Drug use: No  . Sexual activity: Yes  Lifestyle  . Physical activity:    Days per week: Not on file    Minutes per session:  Not on file  . Stress: Not on file  Relationships  . Social connections:    Talks on phone: Not on file    Gets together: Not on file    Attends religious service: Not on file    Active member of club or organization: Not on file    Attends meetings of clubs or organizations: Not on file    Relationship status: Not on file  . Intimate partner violence:    Fear of current or ex partner: Not on file    Emotionally abused: Not on file    Physically abused: Not on file    Forced sexual activity: Not on file  Other Topics Concern  . Not on file  Social History Narrative  . Not on file   Family History  Problem Relation Age of Onset  . Cancer Father        throat cancer/heavy smoker  . Heart disease Maternal Grandfather   . Dementia Paternal Grandmother   . Cancer Paternal Grandfather       Review of Systems  All other systems reviewed and are negative.      Objective:   Physical Exam  Constitutional: He is oriented to person, place, and time. He appears well-developed and well-nourished. No distress.  HENT:  Head: Normocephalic and atraumatic.  Right Ear: External ear normal.  Nose: Nose normal.  Mouth/Throat: Oropharynx is clear  and moist. No oropharyngeal exudate.  Eyes: Pupils are equal, round, and reactive to light. Conjunctivae and EOM are normal. Right eye exhibits no discharge. Left eye exhibits no discharge. No scleral icterus.  Neck: Normal range of motion. Neck supple. No JVD present. No tracheal deviation present. No thyromegaly present.  Cardiovascular: Normal rate, regular rhythm, normal heart sounds and intact distal pulses. Exam reveals no gallop and no friction rub.  No murmur heard. Pulmonary/Chest: Effort normal. No stridor. No respiratory distress. He has no decreased breath sounds. He has no wheezes. He has no rhonchi. He has no rales. He exhibits no tenderness.  Abdominal: Soft. Bowel sounds are normal. He exhibits no distension and no mass. There is no tenderness. There is no rebound and no guarding.  Musculoskeletal: Normal range of motion. He exhibits no edema, tenderness or deformity.  Lymphadenopathy:    He has no cervical adenopathy.  Neurological: He is alert and oriented to person, place, and time. He has normal reflexes. He displays normal reflexes. No cranial nerve deficit. He exhibits normal muscle tone. Coordination normal.  Skin: Skin is warm. No rash noted. He is not diaphoretic. No erythema. No pallor.  Psychiatric: He has a normal mood and affect. His behavior is normal. Judgment and thought content normal.  Vitals reviewed.         Assessment & Plan:  General medical exam - Plan: CBC with Differential/Platelet, COMPLETE METABOLIC PANEL WITH GFR, Lipid panel  Benign essential HTN - Plan: CBC with Differential/Platelet, COMPLETE METABOLIC PANEL WITH GFR, Lipid panel Blood pressure is outstanding.  I would make no changes in hydrochlorothiazide.  I will check fasting lab work including a CBC, CMP, fasting lipid panel.  Patient is not yet due for prostate cancer screening or colon cancer screening.  He is due for a tetanus shot which he declines.  I did recommend smoking cessation.   Patient was giving a starter pack of Chantix.  He was also given a prescription for continuing packs for up to 6 months.  We also discussed how to take it.  Strongly continue  to encourage the patient to take Chantix and quit smoking.

## 2017-08-15 LAB — CBC WITH DIFFERENTIAL/PLATELET
BASOS PCT: 0.6 %
Basophils Absolute: 43 cells/uL (ref 0–200)
Eosinophils Absolute: 92 cells/uL (ref 15–500)
Eosinophils Relative: 1.3 %
HEMATOCRIT: 40.2 % (ref 38.5–50.0)
Hemoglobin: 13.9 g/dL (ref 13.2–17.1)
LYMPHS ABS: 1768 {cells}/uL (ref 850–3900)
MCH: 29.8 pg (ref 27.0–33.0)
MCHC: 34.6 g/dL (ref 32.0–36.0)
MCV: 86.1 fL (ref 80.0–100.0)
MPV: 10.2 fL (ref 7.5–12.5)
Monocytes Relative: 9 %
Neutro Abs: 4558 cells/uL (ref 1500–7800)
Neutrophils Relative %: 64.2 %
Platelets: 384 10*3/uL (ref 140–400)
RBC: 4.67 10*6/uL (ref 4.20–5.80)
RDW: 13.1 % (ref 11.0–15.0)
Total Lymphocyte: 24.9 %
WBC: 7.1 10*3/uL (ref 3.8–10.8)
WBCMIX: 639 {cells}/uL (ref 200–950)

## 2017-08-15 LAB — COMPLETE METABOLIC PANEL WITH GFR
AG RATIO: 1.3 (calc) (ref 1.0–2.5)
ALT: 27 U/L (ref 9–46)
AST: 23 U/L (ref 10–40)
Albumin: 4.2 g/dL (ref 3.6–5.1)
Alkaline phosphatase (APISO): 132 U/L — ABNORMAL HIGH (ref 40–115)
BUN: 14 mg/dL (ref 7–25)
CALCIUM: 9.7 mg/dL (ref 8.6–10.3)
CO2: 35 mmol/L — ABNORMAL HIGH (ref 20–32)
Chloride: 95 mmol/L — ABNORMAL LOW (ref 98–110)
Creat: 1.01 mg/dL (ref 0.60–1.35)
GFR, EST NON AFRICAN AMERICAN: 90 mL/min/{1.73_m2} (ref >=60)
GFR, Est African American: 104 mL/min/{1.73_m2} (ref >=60)
GLOBULIN: 3.3 g/dL (ref 1.9–3.7)
Glucose, Bld: 98 mg/dL (ref 65–99)
POTASSIUM: 3.5 mmol/L (ref 3.5–5.3)
SODIUM: 138 mmol/L (ref 135–146)
Total Bilirubin: 0.4 mg/dL (ref 0.2–1.2)
Total Protein: 7.5 g/dL (ref 6.1–8.1)

## 2017-08-15 LAB — LIPID PANEL
CHOL/HDL RATIO: 5.3 (calc) — AB (ref <5.0)
Cholesterol: 222 mg/dL — ABNORMAL HIGH (ref <200)
HDL: 42 mg/dL (ref >40)
LDL Cholesterol (Calc): 148 mg/dL (calc) — ABNORMAL HIGH
Non-HDL Cholesterol (Calc): 180 mg/dL (calc) — ABNORMAL HIGH (ref <130)
TRIGLYCERIDES: 187 mg/dL — AB (ref <150)

## 2017-08-17 ENCOUNTER — Other Ambulatory Visit: Payer: Self-pay | Admitting: Family Medicine

## 2017-08-17 MED ORDER — ATORVASTATIN CALCIUM 20 MG PO TABS: 20.0000 mg | ORAL_TABLET | Freq: Every day | ORAL | 1 refills | Status: DC

## 2017-08-17 MED FILL — ATORVASTATIN 20 MG TABLET: 20 | 90 days supply | Qty: 90 | Fill #0

## 2017-08-24 MED FILL — CHANTIX STARTING MONTH BOX: 0.5 MG X 11 | 28 days supply | Qty: 53 | Fill #0

## 2017-11-02 ENCOUNTER — Other Ambulatory Visit: Payer: Self-pay | Admitting: Family Medicine

## 2017-11-02 MED FILL — HYDROCHLOROTHIAZIDE 25 MG T: 25 | 90 days supply | Qty: 90 | Fill #2

## 2017-12-09 ENCOUNTER — Ambulatory Visit (INDEPENDENT_AMBULATORY_CARE_PROVIDER_SITE_OTHER): Payer: No Typology Code available for payment source | Admitting: Family Medicine

## 2017-12-09 DIAGNOSIS — Z23 Encounter for immunization: Secondary | ICD-10-CM

## 2017-12-17 ENCOUNTER — Ambulatory Visit (INDEPENDENT_AMBULATORY_CARE_PROVIDER_SITE_OTHER): Payer: No Typology Code available for payment source | Admitting: Family Medicine

## 2017-12-17 DIAGNOSIS — Z23 Encounter for immunization: Secondary | ICD-10-CM

## 2018-03-09 MED FILL — HYDROCHLOROTHIAZIDE 25 MG T: 25 | 90 days supply | Qty: 90 | Fill #3

## 2018-04-19 MED FILL — CHANTIX STARTING MONTH BOX: 0.5 MG X 11 | 28 days supply | Qty: 53 | Fill #0

## 2018-06-30 ENCOUNTER — Other Ambulatory Visit: Payer: Self-pay | Admitting: Family Medicine

## 2018-06-30 MED FILL — HYDROCHLOROTHIAZIDE 25 MG T: 25 | 90 days supply | Qty: 90 | Fill #0

## 2018-10-26 MED FILL — HYDROCHLOROTHIAZIDE 25 MG T: 25 | 90 days supply | Qty: 90 | Fill #1

## 2018-12-16 ENCOUNTER — Other Ambulatory Visit: Payer: Self-pay

## 2018-12-16 ENCOUNTER — Ambulatory Visit (INDEPENDENT_AMBULATORY_CARE_PROVIDER_SITE_OTHER): Payer: No Typology Code available for payment source | Admitting: Family Medicine

## 2018-12-16 ENCOUNTER — Encounter: Payer: Self-pay | Admitting: Family Medicine

## 2018-12-16 VITALS — BP 112/88 | HR 118 | Temp 97.9°F | Resp 16 | Ht 72.0 in | Wt 195.0 lb

## 2018-12-16 DIAGNOSIS — Z23 Encounter for immunization: Secondary | ICD-10-CM

## 2018-12-16 DIAGNOSIS — I10 Essential (primary) hypertension: Secondary | ICD-10-CM | POA: Diagnosis not present

## 2018-12-16 DIAGNOSIS — Z0000 Encounter for general adult medical examination without abnormal findings: Secondary | ICD-10-CM

## 2018-12-16 DIAGNOSIS — Z0001 Encounter for general adult medical examination with abnormal findings: Secondary | ICD-10-CM | POA: Diagnosis not present

## 2018-12-16 DIAGNOSIS — F17200 Nicotine dependence, unspecified, uncomplicated: Secondary | ICD-10-CM | POA: Diagnosis not present

## 2018-12-16 NOTE — Progress Notes (Signed)
Subjective:    Patient ID: Bobby Reed, male    DOB: 03/21/1971, 48 y.o.   MRN: [id number]  HPI  Patient continues to smoke.  He has no desire to quit smoking at the present time.  Otherwise he is doing well with no concerns.  His blood pressure today is excellent.  He is due for a flu shot.  The remainder of his review of systems is negative for any complaints.  He is due for fasting lab work.  He has no family history of colon cancer or prostate cancer and he is asymptomatic.  Therefore he is not due for PSA or colonoscopy at the present time. Past Medical History:  Diagnosis Date  . Hypertension    No past surgical history on file. Current Outpatient Medications on File Prior to Visit  Medication Sig Dispense Refill  . hydrochlorothiazide (HYDRODIURIL) 25 MG tablet TAKE 1 TABLET (25 MG TOTAL) BY MOUTH DAILY. 90 tablet 1  . atorvastatin (LIPITOR) 20 MG tablet Take 1 tablet (20 mg total) by mouth daily. (Patient not taking: Reported on 12/16/2018) 90 tablet 1   No current facility-administered medications on file prior to visit.    No Known Allergies Social History   Socioeconomic History  . Marital status: Married    Spouse name: Not on file  . Number of children: Not on file  . Years of education: Not on file  . Highest education level: Not on file  Occupational History  . Not on file  Social Needs  . Financial resource strain: Not on file  . Food insecurity    Worry: Not on file    Inability: Not on file  . Transportation needs    Medical: Not on file    Non-medical: Not on file  Tobacco Use  . Smoking status: Current Every Day Smoker    Packs/day: 1.00    Types: Cigarettes  . Smokeless tobacco: Never Used  Substance and Sexual Activity  . Alcohol use: No  . Drug use: No  . Sexual activity: Yes  Lifestyle  . Physical activity    Days per week: Not on file    Minutes per session: Not on file  . Stress: Not on file  Relationships  . Social Wellsite geologistconnections     Talks on phone: Not on file    Gets together: Not on file    Attends religious service: Not on file    Active member of club or organization: Not on file    Attends meetings of clubs or organizations: Not on file    Relationship status: Not on file  . Intimate partner violence    Fear of current or ex partner: Not on file    Emotionally abused: Not on file    Physically abused: Not on file    Forced sexual activity: Not on file  Other Topics Concern  . Not on file  Social History Narrative  . Not on file   Family History  Problem Relation Age of Onset  . Cancer Father        throat cancer/heavy smoker  . Heart disease Maternal Grandfather   . Dementia Paternal Grandmother   . Cancer Paternal Grandfather       Review of Systems  All other systems reviewed and are negative.      Objective:   Physical Exam  Constitutional: He is oriented to person, place, and time. He appears well-developed and well-nourished. No distress.  HENT:  Head: Normocephalic and  atraumatic.  Right Ear: External ear normal.  Nose: Nose normal.  Mouth/Throat: Oropharynx is clear and moist. No oropharyngeal exudate.  Eyes: Pupils are equal, round, and reactive to light. Conjunctivae and EOM are normal. Right eye exhibits no discharge. Left eye exhibits no discharge. No scleral icterus.  Neck: Normal range of motion. Neck supple. No JVD present. No tracheal deviation present. No thyromegaly present.  Cardiovascular: Normal rate, regular rhythm, normal heart sounds and intact distal pulses. Exam reveals no gallop and no friction rub.  No murmur heard. Pulmonary/Chest: Effort normal. No stridor. No respiratory distress. He has no decreased breath sounds. He has no wheezes. He has no rhonchi. He has no rales. He exhibits no tenderness.  Abdominal: Soft. Bowel sounds are normal. He exhibits no distension and no mass. There is no abdominal tenderness. There is no rebound and no guarding.  Musculoskeletal:  Normal range of motion.        General: No tenderness, deformity or edema.  Lymphadenopathy:    He has no cervical adenopathy.  Neurological: He is alert and oriented to person, place, and time. He has normal reflexes. No cranial nerve deficit. He exhibits normal muscle tone. Coordination normal.  Skin: Skin is warm. No rash noted. He is not diaphoretic. No erythema. No pallor.  Psychiatric: He has a normal mood and affect. His behavior is normal. Judgment and thought content normal.  Vitals reviewed.         Assessment & Plan:  General medical exam  Need for immunization against influenza - Plan: Flu Vaccine QUAD 36+ mos IM  Benign essential HTN  Smoker Strongly encourage smoking cessation however the patient is in the pre-contemplative phase.  He is not yet due for a PSA or for a colonoscopy.  Patient received his flu shot today.  The remainder of his preventative care is up-to-date.  I will check a CBC, CMP, and fasting lipid panel.  Goal LDL cholesterol is less than 130.  Regular anticipatory guidance is provided.

## 2019-02-10 ENCOUNTER — Other Ambulatory Visit: Payer: Self-pay | Admitting: Family Medicine

## 2019-02-10 MED FILL — HYDROCHLOROTHIAZIDE 25 MG T: 25 | 90 days supply | Qty: 90 | Fill #0

## 2019-05-19 ENCOUNTER — Ambulatory Visit (INDEPENDENT_AMBULATORY_CARE_PROVIDER_SITE_OTHER): Payer: No Typology Code available for payment source | Admitting: Family Medicine

## 2019-05-19 ENCOUNTER — Encounter: Payer: Self-pay | Admitting: Family Medicine

## 2019-05-19 ENCOUNTER — Other Ambulatory Visit: Payer: Self-pay

## 2019-05-19 VITALS — BP 124/80 | HR 74 | Temp 97.7°F | Resp 16 | Ht 72.0 in | Wt 195.0 lb

## 2019-05-19 DIAGNOSIS — B3742 Candidal balanitis: Secondary | ICD-10-CM

## 2019-05-19 DIAGNOSIS — N135 Crossing vessel and stricture of ureter without hydronephrosis: Secondary | ICD-10-CM

## 2019-05-19 LAB — URINALYSIS, ROUTINE W REFLEX MICROSCOPIC
Bilirubin Urine: NEGATIVE
Glucose, UA: NEGATIVE
Hgb urine dipstick: NEGATIVE
Ketones, ur: NEGATIVE
Leukocytes,Ua: NEGATIVE
Nitrite: NEGATIVE
Protein, ur: NEGATIVE
Specific Gravity, Urine: 1.015 (ref 1.001–1.03)
pH: 7 (ref 5.0–8.0)

## 2019-05-19 MED ORDER — CLOTRIMAZOLE 1 % EX CREA: 1.0000 "application " | TOPICAL_CREAM | Freq: Two times a day (BID) | CUTANEOUS | 0 refills | Status: AC

## 2019-05-19 NOTE — Progress Notes (Signed)
Subjective:    Patient ID: Bobby Reed, male    DOB: 1971/02/23, 49 y.o.   MRN: [id number]  HPI  Patient states that he has had a urethral stricture his entire life.  He denies any urinary retention.  He states that he is still able to make a normal stream of urine however it will spurt out and occasionally make a mess.  Sometimes he has dribbling which leaves the head of his penis damp.  Recently the end of his penis has become irritated.  He has an erythematous rash with some white discharge consistent with Candida around the urethral meatus.  There is a visible stricture with only a pinhole size opening through which he can urinate.  His urinalysis today shows no leukocyte esterase, no blood, no nitrites.  Past Medical History:  Diagnosis Date  . Hypertension    No past surgical history on file. Current Outpatient Medications on File Prior to Visit  Medication Sig Dispense Refill  . atorvastatin (LIPITOR) 20 MG tablet Take 1 tablet (20 mg total) by mouth daily. (Patient not taking: Reported on 12/16/2018) 90 tablet 1  . hydrochlorothiazide (HYDRODIURIL) 25 MG tablet TAKE 1 TABLET (25 MG TOTAL) BY MOUTH DAILY. 90 tablet 1   No current facility-administered medications on file prior to visit.   No Known Allergies Social History   Socioeconomic History  . Marital status: Married    Spouse name: Not on file  . Number of children: Not on file  . Years of education: Not on file  . Highest education level: Not on file  Occupational History  . Not on file  Tobacco Use  . Smoking status: Current Every Day Smoker    Packs/day: 1.00    Types: Cigarettes  . Smokeless tobacco: Never Used  Substance and Sexual Activity  . Alcohol use: No  . Drug use: No  . Sexual activity: Yes  Other Topics Concern  . Not on file  Social History Narrative  . Not on file   Social Determinants of Health   Financial Resource Strain:   . Difficulty of Paying Living Expenses: Not on file  Food  Insecurity:   . Worried About Programme researcher, broadcasting/film/video in the Last Year: Not on file  . Ran Out of Food in the Last Year: Not on file  Transportation Needs:   . Lack of Transportation (Medical): Not on file  . Lack of Transportation (Non-Medical): Not on file  Physical Activity:   . Days of Exercise per Week: Not on file  . Minutes of Exercise per Session: Not on file  Stress:   . Feeling of Stress : Not on file  Social Connections:   . Frequency of Communication with Friends and Family: Not on file  . Frequency of Social Gatherings with Friends and Family: Not on file  . Attends Religious Services: Not on file  . Active Member of Clubs or Organizations: Not on file  . Attends Banker Meetings: Not on file  . Marital Status: Not on file  Intimate Partner Violence:   . Fear of Current or Ex-Partner: Not on file  . Emotionally Abused: Not on file  . Physically Abused: Not on file  . Sexually Abused: Not on file   Family History  Problem Relation Age of Onset  . Cancer Father        throat cancer/heavy smoker  . Heart disease Maternal Grandfather   . Dementia Paternal Grandmother   . Cancer Paternal  Grandfather       Review of Systems  All other systems reviewed and are negative.      Objective:   Physical Exam  Constitutional: He is oriented to person, place, and time. He appears well-developed and well-nourished. No distress.  HENT:  Head: Normocephalic and atraumatic.  Right Ear: External ear normal.  Left Ear: External ear normal.  Nose: Nose normal.  Mouth/Throat: Oropharynx is clear and moist. No oropharyngeal exudate.  Eyes: Pupils are equal, round, and reactive to light. Conjunctivae and EOM are normal. Right eye exhibits no discharge. Left eye exhibits no discharge. No scleral icterus.  Neck: No JVD present. No tracheal deviation present. No thyromegaly present.  Cardiovascular: Normal rate, regular rhythm and normal heart sounds. Exam reveals no  gallop and no friction rub.  No murmur heard. Pulmonary/Chest: Effort normal and breath sounds normal. No stridor. No respiratory distress. He has no wheezes. He has no rales. He exhibits no tenderness.  Abdominal: Soft. Bowel sounds are normal. He exhibits no distension and no mass. There is no abdominal tenderness. There is no rebound and no guarding.  Genitourinary: Circumcised. Penile erythema and penile tenderness present.  Musculoskeletal:        General: No tenderness or edema. Normal range of motion.     Cervical back: Normal range of motion and neck supple.  Lymphadenopathy:    He has no cervical adenopathy.  Neurological: He is alert and oriented to person, place, and time. He has normal reflexes. No cranial nerve deficit. He exhibits normal muscle tone. Coordination normal.  Skin: Skin is warm. No rash noted. He is not diaphoretic. No erythema. No pallor.  Psychiatric: He has a normal mood and affect. His behavior is normal. Judgment and thought content normal.  Vitals reviewed.         Assessment & Plan:  Ureteral stricture - Plan: Urinalysis, Routine w reflex microscopic  Candidal balanitis  Treat the Candida balanitis with Lotrimin cream twice daily for 10-14 days. Recommend urology consult to repair the urethral stricture.  Patient declines at the present time.

## 2019-05-27 MED FILL — HYDROCHLOROTHIAZIDE 25 MG T: 25 | 90 days supply | Qty: 90 | Fill #1

## 2019-09-26 ENCOUNTER — Other Ambulatory Visit: Payer: Self-pay | Admitting: Family Medicine

## 2019-09-29 ENCOUNTER — Other Ambulatory Visit: Payer: Self-pay | Admitting: Family Medicine

## 2019-09-29 MED FILL — HYDROCHLOROTHIAZIDE 25 MG T: 25 | 90 days supply | Qty: 90 | Fill #0

## 2019-12-19 ENCOUNTER — Ambulatory Visit (INDEPENDENT_AMBULATORY_CARE_PROVIDER_SITE_OTHER): Payer: No Typology Code available for payment source | Admitting: Family Medicine

## 2019-12-19 ENCOUNTER — Other Ambulatory Visit: Payer: Self-pay

## 2019-12-19 ENCOUNTER — Encounter: Payer: Self-pay | Admitting: Family Medicine

## 2019-12-19 VITALS — BP 124/88 | HR 95 | Resp 16 | Ht 72.0 in | Wt 200.0 lb

## 2019-12-19 DIAGNOSIS — Z0000 Encounter for general adult medical examination without abnormal findings: Secondary | ICD-10-CM

## 2019-12-19 NOTE — Progress Notes (Signed)
Subjective:    Patient ID: Bobby Reed, male    DOB: [birth year], 49 y.o.   MRN: [id number]  HPI  Patient is here today for complete physical exam.  He is trying to quit smoking.  He is weaned himself down to 7 cigarettes a day.  We discussed strategies to quit smoking including Chantix, Wellbutrin, and nicotine replacement.  After discussing his options, the patient is interested in trying nicotine patches.  I recommended trying 7 mg patches.  He could also supplement with nicotine gum for breakthrough urges.  He has had his Covid shot.  He is due for his flu shot.  He politely declines his flu shot.  Otherwise he is doing well with no concerns. Past Medical History:  Diagnosis Date  . Hypertension    No past surgical history on file. Current Outpatient Medications on File Prior to Visit  Medication Sig Dispense Refill  . atorvastatin (LIPITOR) 20 MG tablet Take 1 tablet (20 mg total) by mouth daily. 90 tablet 1  . clotrimazole (LOTRIMIN AF) 1 % cream Apply 1 application topically 2 (two) times daily. 30 g 0  . hydrochlorothiazide (HYDRODIURIL) 25 MG tablet TAKE 1 TABLET BY MOUTH ONCE DAILY 90 tablet 1   No current facility-administered medications on file prior to visit.   No Known Allergies Social History   Socioeconomic History  . Marital status: Married    Spouse name: Not on file  . Number of children: Not on file  . Years of education: Not on file  . Highest education level: Not on file  Occupational History  . Not on file  Tobacco Use  . Smoking status: Current Every Day Smoker    Packs/day: 1.00    Types: Cigarettes  . Smokeless tobacco: Never Used  Substance and Sexual Activity  . Alcohol use: No  . Drug use: No  . Sexual activity: Yes  Other Topics Concern  . Not on file  Social History Narrative  . Not on file   Social Determinants of Health   Financial Resource Strain:   . Difficulty of Paying Living Expenses: Not on file  Food Insecurity:   . Worried  About Programme researcher, broadcasting/film/video in the Last Year: Not on file  . Ran Out of Food in the Last Year: Not on file  Transportation Needs:   . Lack of Transportation (Medical): Not on file  . Lack of Transportation (Non-Medical): Not on file  Physical Activity:   . Days of Exercise per Week: Not on file  . Minutes of Exercise per Session: Not on file  Stress:   . Feeling of Stress : Not on file  Social Connections:   . Frequency of Communication with Friends and Family: Not on file  . Frequency of Social Gatherings with Friends and Family: Not on file  . Attends Religious Services: Not on file  . Active Member of Clubs or Organizations: Not on file  . Attends Banker Meetings: Not on file  . Marital Status: Not on file  Intimate Partner Violence:   . Fear of Current or Ex-Partner: Not on file  . Emotionally Abused: Not on file  . Physically Abused: Not on file  . Sexually Abused: Not on file   Family History  Problem Relation Age of Onset  . Cancer Father        throat cancer/heavy smoker  . Heart disease Maternal Grandfather   . Dementia Paternal Grandmother   . Cancer Paternal Grandfather  Review of Systems  All other systems reviewed and are negative.      Objective:   Physical Exam Vitals reviewed.  Constitutional:      General: He is not in acute distress.    Appearance: He is well-developed. He is not diaphoretic.  HENT:     Head: Normocephalic and atraumatic.     Right Ear: External ear normal.     Nose: Nose normal.     Mouth/Throat:     Pharynx: No oropharyngeal exudate.  Eyes:     General: No scleral icterus.       Right eye: No discharge.        Left eye: No discharge.     Conjunctiva/sclera: Conjunctivae normal.     Pupils: Pupils are equal, round, and reactive to light.  Neck:     Thyroid: No thyromegaly.     Vascular: No JVD.     Trachea: No tracheal deviation.  Cardiovascular:     Rate and Rhythm: Normal rate and regular rhythm.      Heart sounds: Normal heart sounds. No murmur heard.  No friction rub. No gallop.   Pulmonary:     Effort: Pulmonary effort is normal. No respiratory distress.     Breath sounds: No stridor. No decreased breath sounds, wheezing, rhonchi or rales.  Chest:     Chest wall: No tenderness.  Abdominal:     General: Bowel sounds are normal. There is no distension.     Palpations: Abdomen is soft. There is no mass.     Tenderness: There is no abdominal tenderness. There is no guarding or rebound.  Musculoskeletal:        General: No tenderness or deformity. Normal range of motion.     Cervical back: Normal range of motion and neck supple.  Lymphadenopathy:     Cervical: No cervical adenopathy.  Skin:    General: Skin is warm.     Coloration: Skin is not pale.     Findings: No erythema or rash.  Neurological:     Mental Status: He is alert and oriented to person, place, and time.     Cranial Nerves: No cranial nerve deficit.     Motor: No abnormal muscle tone.     Coordination: Coordination normal.     Deep Tendon Reflexes: Reflexes are normal and symmetric.  Psychiatric:        Behavior: Behavior normal.        Thought Content: Thought content normal.        Judgment: Judgment normal.           Assessment & Plan:  General medical exam - Plan: CBC with Differential/Platelet, COMPLETE METABOLIC PANEL WITH GFR, Lipid panel I am extremely proud of the patient for trying to quit smoking.  We will try nicotine patches 7 mg/day.  He can supplement with nicotine gum for breakthrough urges.  Blood pressure today is well controlled.  Check CBC, CMP, fasting lipid panel.  We discussed colon cancer screening including colonoscopy versus Cologuard.  Patient declines this at the present time.  He is not due for prostate cancer screening.  Patient was offered a flu shot and politely declined.  Regular anticipatory guidance is provided

## 2019-12-20 LAB — CBC WITH DIFFERENTIAL/PLATELET
Absolute Monocytes: 561 cells/uL (ref 200–950)
Basophils Absolute: 47 cells/uL (ref 0–200)
Basophils Relative: 0.6 %
Eosinophils Absolute: 87 cells/uL (ref 15–500)
Eosinophils Relative: 1.1 %
HCT: 42.6 % (ref 38.5–50.0)
Hemoglobin: 14.7 g/dL (ref 13.2–17.1)
Lymphs Abs: 1738 cells/uL (ref 850–3900)
MCH: 30.8 pg (ref 27.0–33.0)
MCHC: 34.5 g/dL (ref 32.0–36.0)
MCV: 89.1 fL (ref 80.0–100.0)
MPV: 10 fL (ref 7.5–12.5)
Monocytes Relative: 7.1 %
Neutro Abs: 5467 cells/uL (ref 1500–7800)
Neutrophils Relative %: 69.2 %
Platelets: 375 10*3/uL (ref 140–400)
RBC: 4.78 10*6/uL (ref 4.20–5.80)
RDW: 13.1 % (ref 11.0–15.0)
Total Lymphocyte: 22 %
WBC: 7.9 10*3/uL (ref 3.8–10.8)

## 2019-12-20 LAB — COMPLETE METABOLIC PANEL WITH GFR
AG Ratio: 1.3 (calc) (ref 1.0–2.5)
ALT: 39 U/L (ref 9–46)
AST: 31 U/L (ref 10–40)
Albumin: 4.1 g/dL (ref 3.6–5.1)
Alkaline phosphatase (APISO): 138 U/L — ABNORMAL HIGH (ref 36–130)
BUN/Creatinine Ratio: 6 (calc) (ref 6–22)
BUN: 6 mg/dL — ABNORMAL LOW (ref 7–25)
CO2: 32 mmol/L (ref 20–32)
Calcium: 9.7 mg/dL (ref 8.6–10.3)
Chloride: 94 mmol/L — ABNORMAL LOW (ref 98–110)
Creat: 1.09 mg/dL (ref 0.60–1.35)
GFR, Est African American: 94 mL/min/{1.73_m2} (ref >=60)
GFR, Est Non African American: 81 mL/min/{1.73_m2} (ref >=60)
Globulin: 3.2 g/dL (calc) (ref 1.9–3.7)
Glucose, Bld: 87 mg/dL (ref 65–99)
Potassium: 3.2 mmol/L — ABNORMAL LOW (ref 3.5–5.3)
Sodium: 137 mmol/L (ref 135–146)
Total Bilirubin: 0.4 mg/dL (ref 0.2–1.2)
Total Protein: 7.3 g/dL (ref 6.1–8.1)

## 2019-12-20 LAB — LIPID PANEL
Cholesterol: 228 mg/dL — ABNORMAL HIGH (ref <200)
HDL: 43 mg/dL (ref >=40)
LDL Cholesterol (Calc): 146 mg/dL (calc) — ABNORMAL HIGH
Non-HDL Cholesterol (Calc): 185 mg/dL (calc) — ABNORMAL HIGH (ref <130)
Total CHOL/HDL Ratio: 5.3 (calc) — ABNORMAL HIGH (ref <5.0)
Triglycerides: 243 mg/dL — ABNORMAL HIGH (ref <150)

## 2020-01-06 MED FILL — HYDROCHLOROTHIAZIDE 25 MG T: 25 | 90 days supply | Qty: 90 | Fill #1

## 2020-03-07 ENCOUNTER — Ambulatory Visit
Admission: EM | Admit: 2020-03-07 | Discharge: 2020-03-07 | Disposition: A | Discharge status: Home / Self Care | Payer: 59

## 2020-03-07 ENCOUNTER — Other Ambulatory Visit: Payer: Self-pay

## 2020-03-07 NOTE — ED Triage Notes (Signed)
Pt states he has a piece of metal in his eye

## 2020-03-07 NOTE — ED Notes (Signed)
Patient is being discharged from the Urgent Care and sent to the Emergency Department via pov . Per B. Wurst, patient is in need of higher level of care due to piece of metal in his eye. Patient is aware and verbalizes understanding of plan of care. There were no vitals filed for this visit.

## 2020-03-09 MED FILL — NEO/POLY/DEXAMET EYE OINT: 3.5-10000-0 | 5 days supply | Qty: 4 | Fill #0

## 2020-03-27 ENCOUNTER — Other Ambulatory Visit: Payer: Self-pay | Admitting: Family Medicine

## 2020-04-24 MED FILL — HYDROCHLOROTHIAZIDE 25 MG T: 25 | 90 days supply | Qty: 90 | Fill #0

## 2020-06-26 ENCOUNTER — Other Ambulatory Visit (HOSPITAL_BASED_OUTPATIENT_CLINIC_OR_DEPARTMENT_OTHER): Payer: Self-pay

## 2020-08-13 ENCOUNTER — Other Ambulatory Visit (HOSPITAL_COMMUNITY): Payer: Self-pay

## 2020-08-13 MED FILL — Hydrochlorothiazide Tab 25 MG: ORAL | 90 days supply | Qty: 90 | Fill #0 | Status: AC

## 2020-10-10 ENCOUNTER — Other Ambulatory Visit (HOSPITAL_COMMUNITY): Payer: Self-pay

## 2020-12-03 ENCOUNTER — Other Ambulatory Visit: Payer: Self-pay | Admitting: Family Medicine

## 2020-12-04 ENCOUNTER — Other Ambulatory Visit (HOSPITAL_COMMUNITY): Payer: Self-pay

## 2020-12-05 ENCOUNTER — Other Ambulatory Visit (HOSPITAL_COMMUNITY): Payer: Self-pay

## 2020-12-05 ENCOUNTER — Other Ambulatory Visit: Payer: Self-pay | Admitting: Family Medicine

## 2020-12-06 ENCOUNTER — Other Ambulatory Visit (HOSPITAL_COMMUNITY): Payer: Self-pay

## 2020-12-06 MED ORDER — HYDROCHLOROTHIAZIDE 25 MG PO TABS
ORAL_TABLET | Freq: Every day | ORAL | 1 refills | Status: DC
  Filled 2020-12-06: qty 90, 90d supply, fill #0
  Filled 2021-03-25: qty 90, 90d supply, fill #1

## 2020-12-12 ENCOUNTER — Other Ambulatory Visit (HOSPITAL_COMMUNITY): Payer: Self-pay

## 2020-12-20 ENCOUNTER — Other Ambulatory Visit: Payer: Self-pay

## 2020-12-20 ENCOUNTER — Ambulatory Visit (INDEPENDENT_AMBULATORY_CARE_PROVIDER_SITE_OTHER): Payer: No Typology Code available for payment source | Admitting: Family Medicine

## 2020-12-20 ENCOUNTER — Encounter: Payer: Self-pay | Admitting: Family Medicine

## 2020-12-20 ENCOUNTER — Other Ambulatory Visit (HOSPITAL_COMMUNITY): Payer: Self-pay

## 2020-12-20 VITALS — BP 130/74 | HR 99 | Temp 97.8°F | Resp 14 | Ht 72.0 in | Wt 190.0 lb

## 2020-12-20 DIAGNOSIS — Z0000 Encounter for general adult medical examination without abnormal findings: Secondary | ICD-10-CM | POA: Diagnosis not present

## 2020-12-20 DIAGNOSIS — E7800 Pure hypercholesterolemia, unspecified: Secondary | ICD-10-CM

## 2020-12-20 DIAGNOSIS — I10 Essential (primary) hypertension: Secondary | ICD-10-CM

## 2020-12-20 MED ORDER — ATORVASTATIN CALCIUM 20 MG PO TABS
20.0000 mg | ORAL_TABLET | Freq: Every day | ORAL | 3 refills | Status: DC
Start: 2020-12-20 — End: 2020-12-20

## 2020-12-20 MED ORDER — ATORVASTATIN CALCIUM 20 MG PO TABS
20.0000 mg | ORAL_TABLET | Freq: Every day | ORAL | 3 refills | Status: DC
Start: 2020-12-20 — End: 2022-09-29
  Filled 2020-12-20: qty 90, 90d supply, fill #0
  Filled 2021-03-25: qty 90, 90d supply, fill #1

## 2020-12-20 NOTE — Progress Notes (Signed)
Subjective:    Patient ID: Bobby Reed, male    DOB: [birth year], 50 y.o.   MRN: [id number]  HPI  Patient is a very pleasant 50 year old Caucasian gentleman here today for complete physical exam.  He is a retired Surveyor, minerals.  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  He is very physically active and denies any chest pain or shortness of breath or dyspnea on exertion.  Unfortunately he continues to smoke.  He is well aware of the risk of that and states that he is tried on numerous occasions to quit however has been unsuccessful to date.  He does have a history of hyperlipidemia.  He never started the Lipitor.  He denies any side effects from the Lipitor and he is willing to try it.  Otherwise he is doing well with no concerns.  He is due for colon cancer screening.  He is busy building a house right now and so he is due for that until January.  We did discuss Cologuard and he will think about it and let me know his decision.  Otherwise he is doing well.  He is due for a flu shot but he wants to wait on that as well. Past Medical History:  Diagnosis Date   Hypertension    No past surgical history on file. Current Outpatient Medications on File Prior to Visit  Medication Sig Dispense Refill   atorvastatin (LIPITOR) 20 MG tablet Take 1 tablet (20 mg total) by mouth daily. 90 tablet 1   clotrimazole (LOTRIMIN AF) 1 % cream Apply 1 application topically 2 (two) times daily. 30 g 0   hydrochlorothiazide (HYDRODIURIL) 25 MG tablet TAKE 1 TABLET BY MOUTH ONCE DAILY 90 tablet 1   No current facility-administered medications on file prior to visit.   No Known Allergies Social History   Socioeconomic History   Marital status: Married    Spouse name: Not on file   Number of children: Not on file   Years of education: Not on file   Highest education level: Not on file  Occupational History   Not on file  Tobacco Use   Smoking status: Every Day    Packs/day: 1.00    Types: Cigarettes    Smokeless tobacco: Never  Substance and Sexual Activity   Alcohol use: No   Drug use: No   Sexual activity: Yes  Other Topics Concern   Not on file  Social History Narrative   Not on file   Social Determinants of Health   Financial Resource Strain: Not on file  Food Insecurity: Not on file  Transportation Needs: Not on file  Physical Activity: Not on file  Stress: Not on file  Social Connections: Not on file  Intimate Partner Violence: Not on file   Family History  Problem Relation Age of Onset   Cancer Father        throat cancer/heavy smoker   Heart disease Maternal Grandfather    Dementia Paternal Grandmother    Cancer Paternal Grandfather       Review of Systems  All other systems reviewed and are negative.     Objective:   Physical Exam Vitals reviewed.  Constitutional:      General: He is not in acute distress.    Appearance: He is well-developed. He is not diaphoretic.  HENT:     Head: Normocephalic and atraumatic.     Right Ear: External ear normal.     Nose: Nose normal.  Mouth/Throat:     Pharynx: No oropharyngeal exudate.  Eyes:     General: No scleral icterus.       Right eye: No discharge.        Left eye: No discharge.     Conjunctiva/sclera: Conjunctivae normal.     Pupils: Pupils are equal, round, and reactive to light.  Neck:     Thyroid: No thyromegaly.     Vascular: No JVD.     Trachea: No tracheal deviation.  Cardiovascular:     Rate and Rhythm: Normal rate and regular rhythm.     Heart sounds: Normal heart sounds. No murmur heard.   No friction rub. No gallop.  Pulmonary:     Effort: Pulmonary effort is normal. No respiratory distress.     Breath sounds: No stridor. No decreased breath sounds, wheezing, rhonchi or rales.  Chest:     Chest wall: No tenderness.  Abdominal:     General: Bowel sounds are normal. There is no distension.     Palpations: Abdomen is soft. There is no mass.     Tenderness: There is no abdominal  tenderness. There is no guarding or rebound.  Musculoskeletal:        General: No tenderness or deformity. Normal range of motion.     Cervical back: Normal range of motion and neck supple.  Lymphadenopathy:     Cervical: No cervical adenopathy.  Skin:    General: Skin is warm.     Coloration: Skin is not pale.     Findings: No erythema or rash.  Neurological:     Mental Status: He is alert and oriented to person, place, and time.     Cranial Nerves: No cranial nerve deficit.     Motor: No abnormal muscle tone.     Coordination: Coordination normal.     Deep Tendon Reflexes: Reflexes are normal and symmetric.  Psychiatric:        Behavior: Behavior normal.        Thought Content: Thought content normal.        Judgment: Judgment normal.          Assessment & Plan:  General medical exam  Benign essential HTN  Pure hypercholesterolemia I am really happy with his blood pressure today.  Recommended a flu shot when the patient prefers.  Also recommended a colonoscopy whenever is convenient to the patient.  I will check a CBC, CMP, lipid panel.  I encouraged him to take Lipitor because I would like to try to get his LDL cholesterol below 100 given his smoking, as well as his history of hypertension.  Of note, the patient's father died of cancer at age 58 from metastatic throat cancer.  I wanted to include this in his record.  He denies any dysphagia or trouble swallowing.  Again I encouraged him to quit smoking.

## 2020-12-24 LAB — CBC WITH DIFFERENTIAL/PLATELET
Absolute Monocytes: 655 cells/uL (ref 200–950)
Basophils Absolute: 43 cells/uL (ref 0–200)
Basophils Relative: 0.6 %
Eosinophils Absolute: 108 cells/uL (ref 15–500)
Eosinophils Relative: 1.5 %
HCT: 44.9 % (ref 38.5–50.0)
Hemoglobin: 15 g/dL (ref 13.2–17.1)
Lymphs Abs: 1951 cells/uL (ref 850–3900)
MCH: 30.4 pg (ref 27.0–33.0)
MCHC: 33.4 g/dL (ref 32.0–36.0)
MCV: 90.9 fL (ref 80.0–100.0)
MPV: 10.5 fL (ref 7.5–12.5)
Monocytes Relative: 9.1 %
Neutro Abs: 4442 cells/uL (ref 1500–7800)
Neutrophils Relative %: 61.7 %
Platelets: 337 10*3/uL (ref 140–400)
RBC: 4.94 10*6/uL (ref 4.20–5.80)
RDW: 12.5 % (ref 11.0–15.0)
Total Lymphocyte: 27.1 %
WBC: 7.2 10*3/uL (ref 3.8–10.8)

## 2020-12-24 LAB — LIPID PANEL
Cholesterol: 233 mg/dL — ABNORMAL HIGH (ref <200)
HDL: 44 mg/dL (ref >=40)
LDL Cholesterol (Calc): 157 mg/dL (calc) — ABNORMAL HIGH
Non-HDL Cholesterol (Calc): 189 mg/dL (calc) — ABNORMAL HIGH (ref <130)
Total CHOL/HDL Ratio: 5.3 (calc) — ABNORMAL HIGH (ref <5.0)
Triglycerides: 181 mg/dL — ABNORMAL HIGH (ref <150)

## 2020-12-24 LAB — COMPLETE METABOLIC PANEL WITH GFR
AG Ratio: 1.5 (calc) (ref 1.0–2.5)
ALT: 27 U/L (ref 9–46)
AST: 22 U/L (ref 10–40)
Albumin: 4.5 g/dL (ref 3.6–5.1)
Alkaline phosphatase (APISO): 114 U/L (ref 36–130)
BUN: 10 mg/dL (ref 7–25)
CO2: 34 mmol/L — ABNORMAL HIGH (ref 20–32)
Calcium: 10.3 mg/dL (ref 8.6–10.3)
Chloride: 95 mmol/L — ABNORMAL LOW (ref 98–110)
Creat: 1.04 mg/dL (ref 0.60–1.29)
Globulin: 3 g/dL (calc) (ref 1.9–3.7)
Glucose, Bld: 116 mg/dL — ABNORMAL HIGH (ref 65–99)
Potassium: 4 mmol/L (ref 3.5–5.3)
Sodium: 136 mmol/L (ref 135–146)
Total Bilirubin: 0.5 mg/dL (ref 0.2–1.2)
Total Protein: 7.5 g/dL (ref 6.1–8.1)
eGFR: 89 mL/min/{1.73_m2} (ref >=60)

## 2020-12-24 LAB — TEST AUTHORIZATION

## 2020-12-24 LAB — HEMOGLOBIN A1C W/OUT EAG: Hgb A1c MFr Bld: 5.2 % of total Hgb (ref <5.7)

## 2021-03-14 ENCOUNTER — Telehealth: Payer: Self-pay

## 2021-03-14 NOTE — Telephone Encounter (Signed)
Pt would like to start Chantix  Please advise, thanks!

## 2021-03-15 ENCOUNTER — Other Ambulatory Visit: Payer: Self-pay | Admitting: Family Medicine

## 2021-03-15 MED ORDER — CHANTIX STARTING MONTH PAK 0.5 MG X 11 & 1 MG X 42 PO TBPK: ORAL_TABLET | ORAL | 0 refills | Status: AC

## 2021-03-25 ENCOUNTER — Other Ambulatory Visit (HOSPITAL_COMMUNITY): Payer: Self-pay

## 2021-05-06 ENCOUNTER — Other Ambulatory Visit: Payer: Self-pay | Admitting: Family Medicine

## 2021-05-06 ENCOUNTER — Other Ambulatory Visit (HOSPITAL_COMMUNITY): Payer: Self-pay

## 2021-05-06 MED ORDER — VARENICLINE TARTRATE 0.5 MG PO TABS
0.5000 mg | ORAL_TABLET | Freq: Two times a day (BID) | ORAL | 0 refills | Status: DC
  Filled 2021-05-06: qty 60, 30d supply, fill #0

## 2021-05-23 ENCOUNTER — Other Ambulatory Visit (HOSPITAL_COMMUNITY): Payer: Self-pay

## 2021-06-20 ENCOUNTER — Other Ambulatory Visit: Payer: Self-pay | Admitting: Family Medicine

## 2021-06-20 MED ORDER — VARENICLINE TARTRATE 1 MG PO TABS: 1.0000 mg | ORAL_TABLET | Freq: Two times a day (BID) | ORAL | 5 refills | Status: DC

## 2021-06-20 NOTE — Telephone Encounter (Signed)
Patient called to report he has finished the first dose of Chantix and would like to continue to the next dose. ? ?Please advise, thanks! ?

## 2021-06-27 ENCOUNTER — Other Ambulatory Visit: Payer: Self-pay | Admitting: Family Medicine

## 2021-06-27 ENCOUNTER — Other Ambulatory Visit (HOSPITAL_COMMUNITY): Payer: Self-pay

## 2021-06-29 ENCOUNTER — Other Ambulatory Visit (HOSPITAL_COMMUNITY): Payer: Self-pay

## 2021-07-03 ENCOUNTER — Other Ambulatory Visit (HOSPITAL_COMMUNITY): Payer: Self-pay

## 2021-07-03 ENCOUNTER — Other Ambulatory Visit: Payer: Self-pay

## 2021-07-03 MED ORDER — VARENICLINE TARTRATE 1 MG PO TABS
1.0000 mg | ORAL_TABLET | Freq: Two times a day (BID) | ORAL | 5 refills | Status: AC
  Filled 2021-07-03: qty 56, 28d supply, fill #0

## 2021-07-25 ENCOUNTER — Other Ambulatory Visit: Payer: Self-pay | Admitting: Family Medicine

## 2021-07-25 ENCOUNTER — Other Ambulatory Visit (HOSPITAL_COMMUNITY): Payer: Self-pay

## 2021-07-25 MED ORDER — HYDROCHLOROTHIAZIDE 25 MG PO TABS
ORAL_TABLET | Freq: Every day | ORAL | 1 refills | Status: DC
  Filled 2021-07-25: qty 90, 90d supply, fill #0
  Filled 2021-11-08: qty 90, 90d supply, fill #1

## 2021-11-08 ENCOUNTER — Other Ambulatory Visit (HOSPITAL_COMMUNITY): Payer: Self-pay

## 2022-03-10 ENCOUNTER — Other Ambulatory Visit: Payer: Self-pay | Admitting: Family Medicine

## 2022-03-10 ENCOUNTER — Other Ambulatory Visit (HOSPITAL_COMMUNITY): Payer: Self-pay

## 2022-03-10 MED ORDER — HYDROCHLOROTHIAZIDE 25 MG PO TABS
25.0000 mg | ORAL_TABLET | Freq: Every day | ORAL | 11 refills | Status: DC
  Filled 2022-03-10: qty 30, 30d supply, fill #0
  Filled 2022-04-29: qty 30, 30d supply, fill #1
  Filled 2022-06-10: qty 30, 30d supply, fill #2
  Filled 2022-07-17: qty 30, 30d supply, fill #3
  Filled 2022-08-25: qty 30, 30d supply, fill #4
  Filled 2022-09-29 – 2022-10-07 (×2): qty 30, 30d supply, fill #5
  Filled 2022-11-19: qty 30, 30d supply, fill #6
  Filled 2022-12-31: qty 30, 30d supply, fill #7
  Filled 2023-02-11: qty 30, 30d supply, fill #8

## 2022-04-29 ENCOUNTER — Other Ambulatory Visit (HOSPITAL_COMMUNITY): Payer: Self-pay

## 2022-06-10 ENCOUNTER — Other Ambulatory Visit (HOSPITAL_COMMUNITY): Payer: Self-pay

## 2022-07-17 ENCOUNTER — Other Ambulatory Visit (HOSPITAL_COMMUNITY): Payer: Self-pay

## 2022-08-25 ENCOUNTER — Other Ambulatory Visit: Payer: Self-pay

## 2022-08-27 ENCOUNTER — Other Ambulatory Visit (HOSPITAL_COMMUNITY): Payer: Self-pay

## 2022-09-15 ENCOUNTER — Ambulatory Visit (INDEPENDENT_AMBULATORY_CARE_PROVIDER_SITE_OTHER): Payer: 59 | Admitting: Family Medicine

## 2022-09-15 ENCOUNTER — Encounter: Payer: Self-pay | Admitting: Family Medicine

## 2022-09-15 VITALS — BP 130/72 | HR 74 | Temp 97.8°F | Ht 72.0 in | Wt 188.4 lb

## 2022-09-15 DIAGNOSIS — Z0001 Encounter for general adult medical examination with abnormal findings: Secondary | ICD-10-CM | POA: Diagnosis not present

## 2022-09-15 DIAGNOSIS — Z1211 Encounter for screening for malignant neoplasm of colon: Secondary | ICD-10-CM | POA: Diagnosis not present

## 2022-09-15 DIAGNOSIS — I10 Essential (primary) hypertension: Secondary | ICD-10-CM | POA: Diagnosis not present

## 2022-09-15 DIAGNOSIS — E7800 Pure hypercholesterolemia, unspecified: Secondary | ICD-10-CM | POA: Diagnosis not present

## 2022-09-15 DIAGNOSIS — Z0000 Encounter for general adult medical examination without abnormal findings: Secondary | ICD-10-CM

## 2022-09-15 NOTE — Progress Notes (Signed)
Subjective:    Patient ID: Bobby Reed, male    DOB: 1970-09-09, 52 y.o.   MRN: [id number]  HPI  Patient is a very pleasant 52 year old Caucasian gentleman here today for complete physical exam.  He continues to smoke.  However he is working hard to try to quit smoking.  He has weaned himself down from a pack of cigarettes a day to 6 cigarettes a day.  He declines Wellbutrin or Chantix.  He has tried nicotine patches but they irritate his skin.  We discussed nicotine gum to help with chemical dependency however the majority of his issue seems to be a psychological dependency.  He states that as long as he stays busy he does not feel the need to smoke.  He is not yet due for prostate cancer screening.  He is overdue for colon cancer screening.  We discussed the colonoscopy versus Cologuard and he elects to use Cologuard.  His tetanus shot is up-to-date.  We discussed RSV and COVID.  He is not due for Shingrix until next year.  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  He stopped his cholesterol pill.  He states this has been forming over the last 2 years Past Medical History:  Diagnosis Date   Hyperlipidemia    Hypertension    Smoker    No past surgical history on file. Current Outpatient Medications on File Prior to Visit  Medication Sig Dispense Refill   hydrochlorothiazide (HYDRODIURIL) 25 MG tablet Take 1 tablet (25 mg total) by mouth daily. 30 tablet 11   atorvastatin (LIPITOR) 20 MG tablet Take 1 tablet (20 mg total) by mouth daily. (Patient not taking: Reported on 09/15/2022) 90 tablet 3   clotrimazole (LOTRIMIN AF) 1 % cream Apply 1 application topically 2 (two) times daily. (Patient not taking: Reported on 09/15/2022) 30 g 0   varenicline (CHANTIX CONTINUING MONTH PAK) 1 MG tablet Take 1 tablet (1 mg total) by mouth 2 (two) times daily. (Patient not taking: Reported on 09/15/2022) 60 tablet 5   No current facility-administered medications  on file prior to visit.   No Known Allergies Social History   Socioeconomic History   Marital status: Married    Spouse name: Not on file   Number of children: Not on file   Years of education: Not on file   Highest education level: Not on file  Occupational History   Not on file  Tobacco Use   Smoking status: Every Day    Packs/day: 1    Types: Cigarettes   Smokeless tobacco: Never  Substance and Sexual Activity   Alcohol use: No   Drug use: No   Sexual activity: Yes  Other Topics Concern   Not on file  Social History Narrative   Not on file   Social Determinants of Health   Financial Resource Strain: Not on file  Food Insecurity: Not on file  Transportation Needs: Not on file  Physical Activity: Not on file  Stress: Not on file  Social Connections: Not on file  Intimate Partner Violence: Not on file   Family History  Problem Relation Age of Onset   Cancer Father        throat cancer/heavy smoker   Heart disease Maternal Grandfather    Dementia Paternal Grandmother    Cancer Paternal Grandfather       Review of Systems  All other systems reviewed and are negative.  Objective:   Physical Exam Vitals reviewed.  Constitutional:      General: He is not in acute distress.    Appearance: Normal appearance. He is well-developed. He is not diaphoretic.  HENT:     Head: Normocephalic and atraumatic.     Right Ear: Tympanic membrane, ear canal and external ear normal.     Left Ear: Tympanic membrane and ear canal normal.     Nose: Nose normal.     Mouth/Throat:     Pharynx: No oropharyngeal exudate.  Eyes:     General: No scleral icterus.       Right eye: No discharge.        Left eye: No discharge.     Conjunctiva/sclera: Conjunctivae normal.     Pupils: Pupils are equal, round, and reactive to light.   Neck:     Thyroid: No thyromegaly.     Vascular: No JVD.     Trachea: No tracheal deviation.  Cardiovascular:     Rate and Rhythm: Normal rate  and regular rhythm.     Heart sounds: Normal heart sounds. No murmur heard.    No friction rub. No gallop.  Pulmonary:     Effort: Pulmonary effort is normal. No respiratory distress.     Breath sounds: No stridor. No decreased breath sounds, wheezing, rhonchi or rales.  Chest:     Chest wall: No tenderness.  Abdominal:     General: Bowel sounds are normal. There is no distension.     Palpations: Abdomen is soft. There is no mass.     Tenderness: There is no abdominal tenderness. There is no guarding or rebound.  Musculoskeletal:        General: No tenderness or deformity. Normal range of motion.     Cervical back: Normal range of motion and neck supple.  Lymphadenopathy:     Cervical: No cervical adenopathy.  Skin:    General: Skin is warm.     Coloration: Skin is not pale.     Findings: No erythema or rash.  Neurological:     Mental Status: He is alert and oriented to person, place, and time.     Cranial Nerves: No cranial nerve deficit.     Motor: No abnormal muscle tone.     Coordination: Coordination normal.     Deep Tendon Reflexes: Reflexes are normal and symmetric.  Psychiatric:        Behavior: Behavior normal.        Thought Content: Thought content normal.        Judgment: Judgment normal.           Assessment & Plan:  Colon cancer screening - Plan: Cologuard  Benign essential HTN - Plan: CBC with Differential/Platelet, Lipid panel, COMPLETE METABOLIC PANEL WITH GFR  General medical exam  Pure hypercholesterolemia I am very concerned about his cholesterol given his history of smoking.  Check a fasting lipid panel today.  His blood pressure is excellent.  Hydrochlorothiazide seems to be working well.  Check a CBC and a CMP along with his lipid panel.  Not yet due for prostate cancer screening.  Recommended colon cancer screening and the patient elected to proceed with Cologuard.  Tetanus shot is up-to-date.  Recommended the shingles shot after age 50.   Recommended a COVID booster in the fall.  Discussed RSV because he does have a grandchild under a year of age.  Continue to encourage smoking cessation and the patient is working hard on this

## 2022-09-16 LAB — CBC WITH DIFFERENTIAL/PLATELET
Absolute Monocytes: 809 cells/uL (ref 200–950)
Basophils Absolute: 61 cells/uL (ref 0–200)
Basophils Relative: 0.7 %
Eosinophils Absolute: 226 cells/uL (ref 15–500)
Eosinophils Relative: 2.6 %
HCT: 43.1 % (ref 38.5–50.0)
Hemoglobin: 14.6 g/dL (ref 13.2–17.1)
Lymphs Abs: 2097 cells/uL (ref 850–3900)
MCH: 30.4 pg (ref 27.0–33.0)
MCHC: 33.9 g/dL (ref 32.0–36.0)
MCV: 89.8 fL (ref 80.0–100.0)
MPV: 10 fL (ref 7.5–12.5)
Monocytes Relative: 9.3 %
Neutro Abs: 5507 cells/uL (ref 1500–7800)
Neutrophils Relative %: 63.3 %
Platelets: 380 10*3/uL (ref 140–400)
RBC: 4.8 10*6/uL (ref 4.20–5.80)
RDW: 12.8 % (ref 11.0–15.0)
Total Lymphocyte: 24.1 %
WBC: 8.7 10*3/uL (ref 3.8–10.8)

## 2022-09-16 LAB — COMPLETE METABOLIC PANEL WITH GFR
AG Ratio: 1.3 (calc) (ref 1.0–2.5)
ALT: 28 U/L (ref 9–46)
AST: 23 U/L (ref 10–40)
Albumin: 4.2 g/dL (ref 3.6–5.1)
Alkaline phosphatase (APISO): 138 U/L — ABNORMAL HIGH (ref 36–130)
BUN: 14 mg/dL (ref 7–25)
CO2: 29 mmol/L (ref 20–32)
Calcium: 9.9 mg/dL (ref 8.6–10.3)
Chloride: 101 mmol/L (ref 98–110)
Creat: 0.86 mg/dL (ref 0.60–1.29)
Globulin: 3.2 g/dL (calc) (ref 1.9–3.7)
Glucose, Bld: 83 mg/dL (ref 65–99)
Potassium: 3.8 mmol/L (ref 3.5–5.3)
Sodium: 140 mmol/L (ref 135–146)
Total Bilirubin: 0.4 mg/dL (ref 0.2–1.2)
Total Protein: 7.4 g/dL (ref 6.1–8.1)
eGFR: 106 mL/min/{1.73_m2} (ref >=60)

## 2022-09-16 LAB — LIPID PANEL
Cholesterol: 226 mg/dL — ABNORMAL HIGH (ref <200)
HDL: 43 mg/dL (ref >=40)
LDL Cholesterol (Calc): 152 mg/dL (calc) — ABNORMAL HIGH
Non-HDL Cholesterol (Calc): 183 mg/dL (calc) — ABNORMAL HIGH (ref <130)
Total CHOL/HDL Ratio: 5.3 (calc) — ABNORMAL HIGH (ref <5.0)
Triglycerides: 175 mg/dL — ABNORMAL HIGH (ref <150)

## 2022-09-29 ENCOUNTER — Other Ambulatory Visit: Payer: Self-pay

## 2022-09-29 ENCOUNTER — Other Ambulatory Visit (HOSPITAL_COMMUNITY): Payer: Self-pay

## 2022-09-29 DIAGNOSIS — E7800 Pure hypercholesterolemia, unspecified: Secondary | ICD-10-CM

## 2022-09-29 MED ORDER — ATORVASTATIN CALCIUM 20 MG PO TABS
20.0000 mg | ORAL_TABLET | Freq: Every day | ORAL | 1 refills | Status: AC
Start: 2022-09-29 — End: ?

## 2022-10-01 DIAGNOSIS — H5203 Hypermetropia, bilateral: Secondary | ICD-10-CM | POA: Diagnosis not present

## 2022-10-06 ENCOUNTER — Telehealth: Payer: Self-pay | Admitting: Family Medicine

## 2022-10-06 NOTE — Telephone Encounter (Signed)
In response to letter received via carrier mail, patient called to follow up on lab results.  Requesting call back.   Please advise at [phone].

## 2022-10-06 NOTE — Telephone Encounter (Signed)
Called and spoke w/pt this morning regarding lab results and pcp's notes and recommendations. Pt is aware Rx has been sent to pharmacy. Per pt will called to set up lab appt when it gets closer.   Nothing further.

## 2022-10-07 ENCOUNTER — Other Ambulatory Visit (HOSPITAL_COMMUNITY): Payer: Self-pay

## 2022-11-19 ENCOUNTER — Other Ambulatory Visit (HOSPITAL_COMMUNITY): Payer: Self-pay

## 2022-12-31 ENCOUNTER — Other Ambulatory Visit (HOSPITAL_COMMUNITY): Payer: Self-pay

## 2023-02-11 ENCOUNTER — Other Ambulatory Visit (HOSPITAL_COMMUNITY): Payer: Self-pay

## 2023-04-07 ENCOUNTER — Other Ambulatory Visit: Payer: Self-pay

## 2023-04-07 ENCOUNTER — Other Ambulatory Visit: Payer: Self-pay | Admitting: Family Medicine

## 2023-04-07 ENCOUNTER — Other Ambulatory Visit (HOSPITAL_COMMUNITY): Payer: Self-pay

## 2023-04-07 MED ORDER — HYDROCHLOROTHIAZIDE 25 MG PO TABS
25.0000 mg | ORAL_TABLET | Freq: Every day | ORAL | 11 refills | Status: DC
  Filled 2023-04-07: qty 30, 30d supply, fill #0
  Filled 2023-05-28: qty 30, 30d supply, fill #1
  Filled 2023-07-17: qty 30, 30d supply, fill #2
  Filled 2023-08-24: qty 30, 30d supply, fill #3
  Filled 2023-10-19: qty 30, 30d supply, fill #4
  Filled 2023-12-17: qty 30, 30d supply, fill #5
  Filled 2024-01-29: qty 30, 30d supply, fill #6
  Filled 2024-03-22: qty 30, 30d supply, fill #7

## 2023-05-28 ENCOUNTER — Other Ambulatory Visit (HOSPITAL_COMMUNITY): Payer: Self-pay

## 2023-07-17 ENCOUNTER — Other Ambulatory Visit (HOSPITAL_COMMUNITY): Payer: Self-pay

## 2023-08-24 ENCOUNTER — Other Ambulatory Visit (HOSPITAL_COMMUNITY): Payer: Self-pay

## 2023-10-19 ENCOUNTER — Other Ambulatory Visit (HOSPITAL_COMMUNITY): Payer: Self-pay

## 2023-11-26 ENCOUNTER — Encounter: Payer: Self-pay | Admitting: Emergency Medicine

## 2023-11-26 ENCOUNTER — Ambulatory Visit: Admitting: Family Medicine

## 2023-11-26 ENCOUNTER — Encounter: Payer: Self-pay | Admitting: Family Medicine

## 2023-11-26 VITALS — BP 110/62 | HR 63 | Temp 97.6°F | Ht 72.0 in | Wt 184.8 lb

## 2023-11-26 DIAGNOSIS — Z0000 Encounter for general adult medical examination without abnormal findings: Secondary | ICD-10-CM

## 2023-11-26 DIAGNOSIS — Z0001 Encounter for general adult medical examination with abnormal findings: Secondary | ICD-10-CM

## 2023-11-26 DIAGNOSIS — Z125 Encounter for screening for malignant neoplasm of prostate: Secondary | ICD-10-CM

## 2023-11-26 DIAGNOSIS — Z1211 Encounter for screening for malignant neoplasm of colon: Secondary | ICD-10-CM

## 2023-11-26 DIAGNOSIS — Z122 Encounter for screening for malignant neoplasm of respiratory organs: Secondary | ICD-10-CM

## 2023-11-26 DIAGNOSIS — Z23 Encounter for immunization: Secondary | ICD-10-CM

## 2023-11-26 DIAGNOSIS — E7800 Pure hypercholesterolemia, unspecified: Secondary | ICD-10-CM | POA: Diagnosis not present

## 2023-11-26 DIAGNOSIS — Z13220 Encounter for screening for lipoid disorders: Secondary | ICD-10-CM

## 2023-11-26 NOTE — Progress Notes (Signed)
 Subjective:    Patient ID: Bobby Reed, male    DOB: 07-13-1970, 53 y.o.   MRN: [id number]  HPI  Patient is a very pleasant 53 year old Caucasian gentleman here today for complete physical exam.  He continues to smoke.  Patient is due for lung cancer screening now that he is 52.  His father died from lung cancer at 64.  He does consent to lung cancer screening.  He is also overdue for a colon cancer screening test.  We discussed his options and he elects to proceed with Cologuard.  He is due for a PSA to screen for prostate cancer.  He is due for the shingles vaccine and the pneumonia vaccine.  He declines the pneumonia vaccine today but he consents to receive the shingles vaccine.  His blood pressure is excellent today.  His review of systems is negative. Past Medical History:  Diagnosis Date   Hyperlipidemia    Hypertension    Smoker    No past surgical history on file. Current Outpatient Medications on File Prior to Visit  Medication Sig Dispense Refill   atorvastatin  (LIPITOR) 20 MG tablet Take 1 tablet (20 mg total) by mouth daily. 90 tablet 1   hydrochlorothiazide  (HYDRODIURIL ) 25 MG tablet Take 1 tablet (25 mg total) by mouth daily. 30 tablet 11   clotrimazole  (LOTRIMIN  AF) 1 % cream Apply 1 application topically 2 (two) times daily. (Patient not taking: Reported on 11/26/2023) 30 g 0   varenicline  (CHANTIX  CONTINUING MONTH PAK) 1 MG tablet Take 1 tablet (1 mg total) by mouth 2 (two) times daily. (Patient not taking: Reported on 11/26/2023) 60 tablet 5   No current facility-administered medications on file prior to visit.   No Known Allergies Social History   Socioeconomic History   Marital status: Married    Spouse name: Not on file   Number of children: Not on file   Years of education: Not on file   Highest education level: Not on file  Occupational History   Not on file  Tobacco Use   Smoking status: Every Day    Current packs/day: 1.00    Types: Cigarettes    Smokeless tobacco: Never  Substance and Sexual Activity   Alcohol use: No   Drug use: No   Sexual activity: Yes  Other Topics Concern   Not on file  Social History Narrative   Not on file   Social Drivers of Health   Financial Resource Strain: Not on file  Food Insecurity: Not on file  Transportation Needs: Not on file  Physical Activity: Not on file  Stress: Not on file  Social Connections: Not on file  Intimate Partner Violence: Not on file   Family History  Problem Relation Age of Onset   Cancer Father        throat cancer/heavy smoker   Heart disease Maternal Grandfather    Dementia Paternal Grandmother    Cancer Paternal Grandfather       Review of Systems  All other systems reviewed and are negative.      Objective:   Physical Exam Vitals reviewed.  Constitutional:      General: He is not in acute distress.    Appearance: Normal appearance. He is well-developed. He is not diaphoretic.  HENT:     Head: Normocephalic and atraumatic.     Right Ear: Tympanic membrane, ear canal and external ear normal.     Left Ear: Tympanic membrane and ear canal normal.  Nose: Nose normal.     Mouth/Throat:     Pharynx: No oropharyngeal exudate.  Eyes:     General: No scleral icterus.       Right eye: No discharge.        Left eye: No discharge.     Conjunctiva/sclera: Conjunctivae normal.     Pupils: Pupils are equal, round, and reactive to light.   Neck:     Thyroid: No thyromegaly.     Vascular: No JVD.     Trachea: No tracheal deviation.  Cardiovascular:     Rate and Rhythm: Normal rate and regular rhythm.     Heart sounds: Normal heart sounds. No murmur heard.    No friction rub. No gallop.  Pulmonary:     Effort: Pulmonary effort is normal. No respiratory distress.     Breath sounds: No stridor. No decreased breath sounds, wheezing, rhonchi or rales.  Chest:     Chest wall: No tenderness.  Abdominal:     General: Bowel sounds are normal. There is no  distension.     Palpations: Abdomen is soft. There is no mass.     Tenderness: There is no abdominal tenderness. There is no guarding or rebound.  Musculoskeletal:        General: No tenderness or deformity. Normal range of motion.     Cervical back: Normal range of motion and neck supple.  Lymphadenopathy:     Cervical: No cervical adenopathy.  Skin:    General: Skin is warm.     Coloration: Skin is not pale.     Findings: No erythema or rash.  Neurological:     Mental Status: He is alert and oriented to person, place, and time.     Cranial Nerves: No cranial nerve deficit.     Motor: No abnormal muscle tone.     Coordination: Coordination normal.     Deep Tendon Reflexes: Reflexes are normal and symmetric.  Psychiatric:        Behavior: Behavior normal.        Thought Content: Thought content normal.        Judgment: Judgment normal.           Assessment & Plan:  Colon cancer screening - Plan: Cologuard  Screening for lung cancer - Plan: CT CHEST LUNG CA SCREEN LOW DOSE W/O CM  Prostate cancer screening - Plan: PSA  Screening cholesterol level - Plan: CBC with Differential/Platelet, Comprehensive metabolic panel with GFR, Lipid panel  Need for vaccination - Plan: Varicella-zoster vaccine IM  General medical exam Strongly encouraged the patient to quit smoking.  Screen the patient for colon cancer with Cologuard.  Screening for prostate cancer with a PSA.  Screen for lung cancer with a CT scan.  Check CBC, CMP, fasting lipid panel.  Goal LDL cholesterol is less than 100.  Blood pressure is outstanding.  Patient received the shingles vaccine.  He defers the pneumonia vaccine.  We discussed flu, COVID, and RSV vaccines.

## 2023-11-27 ENCOUNTER — Ambulatory Visit: Payer: Self-pay | Admitting: Family Medicine

## 2023-11-27 LAB — COMPREHENSIVE METABOLIC PANEL WITH GFR
AG Ratio: 1.4 (calc) (ref 1.0–2.5)
ALT: 27 U/L (ref 9–46)
AST: 24 U/L (ref 10–35)
Albumin: 4.5 g/dL (ref 3.6–5.1)
Alkaline phosphatase (APISO): 135 U/L (ref 35–144)
BUN: 18 mg/dL (ref 7–25)
CO2: 32 mmol/L (ref 20–32)
Calcium: 10 mg/dL (ref 8.6–10.3)
Chloride: 97 mmol/L — ABNORMAL LOW (ref 98–110)
Creat: 1.12 mg/dL (ref 0.70–1.30)
Globulin: 3.3 g/dL (ref 1.9–3.7)
Glucose, Bld: 88 mg/dL (ref 65–99)
Potassium: 3.9 mmol/L (ref 3.5–5.3)
Sodium: 139 mmol/L (ref 135–146)
Total Bilirubin: 0.5 mg/dL (ref 0.2–1.2)
Total Protein: 7.8 g/dL (ref 6.1–8.1)
eGFR: 80 mL/min/1.73m2 (ref >=60)

## 2023-11-27 LAB — CBC WITH DIFFERENTIAL/PLATELET
Absolute Lymphocytes: 1877 {cells}/uL (ref 850–3900)
Absolute Monocytes: 680 {cells}/uL (ref 200–950)
Basophils Absolute: 38 {cells}/uL (ref 0–200)
Basophils Relative: 0.6 %
Eosinophils Absolute: 271 {cells}/uL (ref 15–500)
Eosinophils Relative: 4.3 %
HCT: 44.7 % (ref 38.5–50.0)
Hemoglobin: 15.1 g/dL (ref 13.2–17.1)
MCH: 30.5 pg (ref 27.0–33.0)
MCHC: 33.8 g/dL (ref 32.0–36.0)
MCV: 90.3 fL (ref 80.0–100.0)
MPV: 10.2 fL (ref 7.5–12.5)
Monocytes Relative: 10.8 %
Neutro Abs: 3434 {cells}/uL (ref 1500–7800)
Neutrophils Relative %: 54.5 %
Platelets: 356 Thousand/uL (ref 140–400)
RBC: 4.95 Million/uL (ref 4.20–5.80)
RDW: 13.1 % (ref 11.0–15.0)
Total Lymphocyte: 29.8 %
WBC: 6.3 Thousand/uL (ref 3.8–10.8)

## 2023-11-27 LAB — LIPID PANEL
Cholesterol: 250 mg/dL — ABNORMAL HIGH (ref <200)
HDL: 47 mg/dL (ref >=40)
LDL Cholesterol (Calc): 169 mg/dL — ABNORMAL HIGH
Non-HDL Cholesterol (Calc): 203 mg/dL — ABNORMAL HIGH (ref <130)
Total CHOL/HDL Ratio: 5.3 (calc) — ABNORMAL HIGH (ref <5.0)
Triglycerides: 188 mg/dL — ABNORMAL HIGH (ref <150)

## 2023-11-27 LAB — PSA: PSA: 0.3 ng/mL (ref <=4.00)

## 2023-12-17 ENCOUNTER — Other Ambulatory Visit (HOSPITAL_COMMUNITY): Payer: Self-pay

## 2024-01-26 ENCOUNTER — Ambulatory Visit (INDEPENDENT_AMBULATORY_CARE_PROVIDER_SITE_OTHER)

## 2024-01-26 DIAGNOSIS — Z23 Encounter for immunization: Secondary | ICD-10-CM

## 2024-01-26 NOTE — Progress Notes (Signed)
 Patient is in office today for a nurse visit for Immunization. Patient Injection was given in the  Left deltoid. Patient tolerated injection well.

## 2024-01-29 ENCOUNTER — Other Ambulatory Visit (HOSPITAL_COMMUNITY): Payer: Self-pay

## 2024-03-22 ENCOUNTER — Other Ambulatory Visit (HOSPITAL_COMMUNITY): Payer: Self-pay

## 2024-05-12 ENCOUNTER — Other Ambulatory Visit: Payer: Self-pay

## 2024-05-12 ENCOUNTER — Other Ambulatory Visit (HOSPITAL_COMMUNITY): Payer: Self-pay

## 2024-05-12 ENCOUNTER — Other Ambulatory Visit: Payer: Self-pay | Admitting: Family Medicine

## 2024-05-12 MED ORDER — HYDROCHLOROTHIAZIDE 25 MG PO TABS
25.0000 mg | ORAL_TABLET | Freq: Every day | ORAL | 11 refills | Status: AC
  Filled 2024-05-12: qty 90, 90d supply, fill #0

## 2024-11-22 ENCOUNTER — Other Ambulatory Visit

## 2024-11-28 ENCOUNTER — Encounter: Admitting: Family Medicine
